# Patient Record
Sex: Male | Born: 1935 | Race: White | Hispanic: No | Smoking: Never smoker
Health system: Southern US, Community
[De-identification: ages and names within clinical notes are randomized; demographics above are authoritative.]

## PROBLEM LIST (undated history)

## (undated) DIAGNOSIS — I4891 Unspecified atrial fibrillation: Secondary | ICD-10-CM

## (undated) DIAGNOSIS — K219 Gastro-esophageal reflux disease without esophagitis: Secondary | ICD-10-CM

## (undated) DIAGNOSIS — F0390 Unspecified dementia without behavioral disturbance: Secondary | ICD-10-CM

## (undated) DIAGNOSIS — R0902 Hypoxemia: Secondary | ICD-10-CM

## (undated) DIAGNOSIS — IMO0001 Reserved for inherently not codable concepts without codable children: Secondary | ICD-10-CM

## (undated) DIAGNOSIS — Z7901 Long term (current) use of anticoagulants: Secondary | ICD-10-CM

## (undated) DIAGNOSIS — I10 Essential (primary) hypertension: Secondary | ICD-10-CM

## (undated) DIAGNOSIS — I509 Heart failure, unspecified: Secondary | ICD-10-CM

## (undated) DIAGNOSIS — I639 Cerebral infarction, unspecified: Secondary | ICD-10-CM

## (undated) DIAGNOSIS — I219 Acute myocardial infarction, unspecified: Secondary | ICD-10-CM

## (undated) DIAGNOSIS — E785 Hyperlipidemia, unspecified: Secondary | ICD-10-CM

## (undated) DIAGNOSIS — I499 Cardiac arrhythmia, unspecified: Secondary | ICD-10-CM

## (undated) DIAGNOSIS — E876 Hypokalemia: Secondary | ICD-10-CM

## (undated) HISTORY — DX: Acute myocardial infarction, unspecified: I21.9

## (undated) HISTORY — DX: Cerebral infarction, unspecified: I63.9

## (undated) HISTORY — DX: Unspecified atrial fibrillation: I48.91

## (undated) HISTORY — DX: Essential (primary) hypertension: I10

## (undated) HISTORY — DX: Gastro-esophageal reflux disease without esophagitis: K21.9

## (undated) HISTORY — DX: Unspecified dementia, unspecified severity, without behavioral disturbance, psychotic disturbance, mood disturbance, and anxiety: F03.90

## (undated) HISTORY — DX: Long term (current) use of anticoagulants: Z79.01

## (undated) HISTORY — DX: Hyperlipidemia, unspecified: E78.5

## (undated) HISTORY — DX: Hypoxemia: R09.02

## (undated) HISTORY — DX: Hypokalemia: E87.6

## (undated) HISTORY — DX: Heart failure, unspecified: I50.9

---

## 2007-05-01 ENCOUNTER — Inpatient Hospital Stay: Payer: Self-pay | Admitting: Internal Medicine

## 2007-05-01 ENCOUNTER — Other Ambulatory Visit: Payer: Self-pay

## 2008-10-02 ENCOUNTER — Ambulatory Visit: Payer: Self-pay | Admitting: Family Medicine

## 2010-02-15 ENCOUNTER — Ambulatory Visit: Payer: Self-pay | Admitting: Ophthalmology

## 2010-02-26 ENCOUNTER — Ambulatory Visit: Payer: Self-pay | Admitting: Ophthalmology

## 2010-05-14 ENCOUNTER — Other Ambulatory Visit: Payer: Self-pay | Admitting: Internal Medicine

## 2010-10-05 ENCOUNTER — Inpatient Hospital Stay: Payer: Self-pay | Admitting: Internal Medicine

## 2011-03-20 ENCOUNTER — Inpatient Hospital Stay: Payer: Self-pay | Admitting: Internal Medicine

## 2011-03-20 DIAGNOSIS — I369 Nonrheumatic tricuspid valve disorder, unspecified: Secondary | ICD-10-CM

## 2011-03-20 DIAGNOSIS — I509 Heart failure, unspecified: Secondary | ICD-10-CM

## 2011-04-30 ENCOUNTER — Other Ambulatory Visit: Payer: Self-pay | Admitting: Internal Medicine

## 2011-10-10 ENCOUNTER — Other Ambulatory Visit: Payer: Self-pay | Admitting: Internal Medicine

## 2011-10-10 LAB — PROTIME-INR: Prothrombin Time: 24.3 secs — ABNORMAL HIGH (ref 11.5–14.7)

## 2011-11-10 ENCOUNTER — Ambulatory Visit: Payer: Self-pay | Admitting: Ophthalmology

## 2011-11-10 LAB — POTASSIUM: Potassium: 4.6 mmol/L (ref 3.5–5.1)

## 2011-11-25 ENCOUNTER — Ambulatory Visit: Payer: Self-pay | Admitting: Ophthalmology

## 2011-12-10 DIAGNOSIS — I4891 Unspecified atrial fibrillation: Secondary | ICD-10-CM

## 2012-01-30 ENCOUNTER — Other Ambulatory Visit: Payer: Self-pay | Admitting: Internal Medicine

## 2012-01-30 LAB — CBC WITH DIFFERENTIAL/PLATELET
Basophil #: 0 10*3/uL (ref 0.0–0.1)
Basophil %: 0.7 %
Eosinophil #: 0.1 10*3/uL (ref 0.0–0.7)
HGB: 14.1 g/dL (ref 13.0–18.0)
Lymphocyte %: 30.1 %
MCH: 32 pg (ref 26.0–34.0)
MCHC: 34.6 g/dL (ref 32.0–36.0)
Monocyte #: 0.5 x10 3/mm (ref 0.2–1.0)
Neutrophil #: 2.8 10*3/uL (ref 1.4–6.5)
Neutrophil %: 58.3 %
RDW: 15 % — ABNORMAL HIGH (ref 11.5–14.5)
WBC: 4.8 10*3/uL (ref 3.8–10.6)

## 2012-01-30 LAB — COMPREHENSIVE METABOLIC PANEL
Alkaline Phosphatase: 68 U/L (ref 50–136)
Anion Gap: 10 (ref 7–16)
BUN: 19 mg/dL — ABNORMAL HIGH (ref 7–18)
Bilirubin,Total: 0.9 mg/dL (ref 0.2–1.0)
Co2: 23 mmol/L (ref 21–32)
Creatinine: 1.03 mg/dL (ref 0.60–1.30)
EGFR (Non-African Amer.): >60
Glucose: 78 mg/dL (ref 65–99)
SGPT (ALT): 24 U/L (ref 12–78)
Total Protein: 6.9 g/dL (ref 6.4–8.2)

## 2012-01-30 LAB — PROTIME-INR: INR: 1.7

## 2012-03-25 DIAGNOSIS — E876 Hypokalemia: Secondary | ICD-10-CM | POA: Insufficient documentation

## 2012-03-25 DIAGNOSIS — Z9229 Personal history of other drug therapy: Secondary | ICD-10-CM | POA: Insufficient documentation

## 2013-04-28 ENCOUNTER — Ambulatory Visit: Payer: Self-pay | Admitting: Internal Medicine

## 2013-04-28 LAB — PROTIME-INR
INR: 2.1
Prothrombin Time: 23.4 secs — ABNORMAL HIGH (ref 11.5–14.7)

## 2013-09-04 ENCOUNTER — Inpatient Hospital Stay: Payer: Self-pay | Admitting: Family Medicine

## 2013-09-04 LAB — CBC
HCT: 38.3 % — ABNORMAL LOW (ref 40.0–52.0)
HGB: 12.6 g/dL — ABNORMAL LOW (ref 13.0–18.0)
MCH: 30.7 pg (ref 26.0–34.0)
MCHC: 32.8 g/dL (ref 32.0–36.0)
MCV: 94 fL (ref 80–100)
PLATELETS: 73 10*3/uL — AB (ref 150–440)
RBC: 4.1 10*6/uL — ABNORMAL LOW (ref 4.40–5.90)
RDW: 15.5 % — ABNORMAL HIGH (ref 11.5–14.5)
WBC: 3.4 10*3/uL — AB (ref 3.8–10.6)

## 2013-09-04 LAB — MAGNESIUM: Magnesium: 2 mg/dL

## 2013-09-04 LAB — BASIC METABOLIC PANEL
ANION GAP: 7 (ref 7–16)
BUN: 25 mg/dL — ABNORMAL HIGH (ref 7–18)
CO2: 27 mmol/L (ref 21–32)
Calcium, Total: 7.8 mg/dL — ABNORMAL LOW (ref 8.5–10.1)
Chloride: 104 mmol/L (ref 98–107)
Creatinine: 1.31 mg/dL — ABNORMAL HIGH (ref 0.60–1.30)
EGFR (African American): 55 — ABNORMAL LOW
EGFR (Non-African Amer.): 47 — ABNORMAL LOW
GLUCOSE: 153 mg/dL — AB (ref 65–99)
OSMOLALITY: 283 (ref 275–301)
Potassium: 3.4 mmol/L — ABNORMAL LOW (ref 3.5–5.1)
Sodium: 138 mmol/L (ref 136–145)

## 2013-09-04 LAB — TROPONIN I
Troponin-I: 0.04 ng/mL
Troponin-I: 0.05 ng/mL

## 2013-09-04 LAB — PROTIME-INR
INR: 1.5
PROTHROMBIN TIME: 17.9 s — AB (ref 11.5–14.7)

## 2013-09-04 LAB — PRO B NATRIURETIC PEPTIDE: B-TYPE NATIURETIC PEPTID: 4807 pg/mL — AB (ref 0–450)

## 2013-09-05 LAB — COMPREHENSIVE METABOLIC PANEL
ALBUMIN: 3.1 g/dL — AB (ref 3.4–5.0)
ALK PHOS: 55 U/L
ALT: 27 U/L (ref 12–78)
ANION GAP: 7 (ref 7–16)
BILIRUBIN TOTAL: 1.1 mg/dL — AB (ref 0.2–1.0)
BUN: 21 mg/dL — AB (ref 7–18)
CHLORIDE: 100 mmol/L (ref 98–107)
CO2: 30 mmol/L (ref 21–32)
Calcium, Total: 7.9 mg/dL — ABNORMAL LOW (ref 8.5–10.1)
Creatinine: 1.11 mg/dL (ref 0.60–1.30)
EGFR (Non-African Amer.): 58 — ABNORMAL LOW
Glucose: 92 mg/dL (ref 65–99)
Osmolality: 276 (ref 275–301)
Potassium: 3.1 mmol/L — ABNORMAL LOW (ref 3.5–5.1)
SGOT(AST): 51 U/L — ABNORMAL HIGH (ref 15–37)
Sodium: 137 mmol/L (ref 136–145)
Total Protein: 6.6 g/dL (ref 6.4–8.2)

## 2013-09-05 LAB — LIPID PANEL
Cholesterol: 103 mg/dL (ref 0–200)
HDL Cholesterol: 30 mg/dL — ABNORMAL LOW (ref 40–60)
Ldl Cholesterol, Calc: 60 mg/dL (ref 0–100)
Triglycerides: 64 mg/dL (ref 0–200)
VLDL CHOLESTEROL, CALC: 13 mg/dL (ref 5–40)

## 2013-09-05 LAB — CBC WITH DIFFERENTIAL/PLATELET
Basophil #: 0 10*3/uL (ref 0.0–0.1)
Basophil %: 0.4 %
EOS ABS: 0 10*3/uL (ref 0.0–0.7)
EOS PCT: 0 %
HCT: 36.6 % — ABNORMAL LOW (ref 40.0–52.0)
HGB: 12.2 g/dL — ABNORMAL LOW (ref 13.0–18.0)
Lymphocyte #: 0.8 10*3/uL — ABNORMAL LOW (ref 1.0–3.6)
Lymphocyte %: 18.8 %
MCH: 30.7 pg (ref 26.0–34.0)
MCHC: 33.4 g/dL (ref 32.0–36.0)
MCV: 92 fL (ref 80–100)
MONO ABS: 0.4 x10 3/mm (ref 0.2–1.0)
MONOS PCT: 9.7 %
Neutrophil #: 3.2 10*3/uL (ref 1.4–6.5)
Neutrophil %: 71.1 %
PLATELETS: 70 10*3/uL — AB (ref 150–440)
RBC: 3.99 10*6/uL — ABNORMAL LOW (ref 4.40–5.90)
RDW: 15 % — ABNORMAL HIGH (ref 11.5–14.5)
WBC: 4.5 10*3/uL (ref 3.8–10.6)

## 2013-09-05 LAB — PROTIME-INR
INR: 1.5
PROTHROMBIN TIME: 18.1 s — AB (ref 11.5–14.7)

## 2013-09-06 LAB — BASIC METABOLIC PANEL
ANION GAP: 5 — AB (ref 7–16)
BUN: 24 mg/dL — ABNORMAL HIGH (ref 7–18)
CREATININE: 1.11 mg/dL (ref 0.60–1.30)
Calcium, Total: 7.8 mg/dL — ABNORMAL LOW (ref 8.5–10.1)
Chloride: 99 mmol/L (ref 98–107)
Co2: 33 mmol/L — ABNORMAL HIGH (ref 21–32)
EGFR (African American): >60
EGFR (Non-African Amer.): 58 — ABNORMAL LOW
Glucose: 84 mg/dL (ref 65–99)
OSMOLALITY: 277 (ref 275–301)
POTASSIUM: 3.2 mmol/L — AB (ref 3.5–5.1)
Sodium: 137 mmol/L (ref 136–145)

## 2013-09-06 LAB — CBC WITH DIFFERENTIAL/PLATELET
Basophil #: 0 10*3/uL (ref 0.0–0.1)
Basophil %: 0.4 %
EOS ABS: 0 10*3/uL (ref 0.0–0.7)
Eosinophil %: 0.2 %
HCT: 37.6 % — ABNORMAL LOW (ref 40.0–52.0)
HGB: 12.3 g/dL — AB (ref 13.0–18.0)
LYMPHS PCT: 24 %
Lymphocyte #: 1 10*3/uL (ref 1.0–3.6)
MCH: 29.9 pg (ref 26.0–34.0)
MCHC: 32.7 g/dL (ref 32.0–36.0)
MCV: 92 fL (ref 80–100)
Monocyte #: 0.4 x10 3/mm (ref 0.2–1.0)
Monocyte %: 10.2 %
Neutrophil #: 2.7 10*3/uL (ref 1.4–6.5)
Neutrophil %: 65.2 %
PLATELETS: 83 10*3/uL — AB (ref 150–440)
RBC: 4.11 10*6/uL — ABNORMAL LOW (ref 4.40–5.90)
RDW: 15.1 % — AB (ref 11.5–14.5)
WBC: 4.1 10*3/uL (ref 3.8–10.6)

## 2013-09-06 LAB — PROTIME-INR
INR: 1.6
Prothrombin Time: 19 secs — ABNORMAL HIGH (ref 11.5–14.7)

## 2013-09-06 LAB — MAGNESIUM: Magnesium: 2.1 mg/dL

## 2013-09-07 LAB — BASIC METABOLIC PANEL
Anion Gap: 5 — ABNORMAL LOW (ref 7–16)
BUN: 32 mg/dL — ABNORMAL HIGH (ref 7–18)
CALCIUM: 8.1 mg/dL — AB (ref 8.5–10.1)
Chloride: 100 mmol/L (ref 98–107)
Co2: 32 mmol/L (ref 21–32)
Creatinine: 1.19 mg/dL (ref 0.60–1.30)
EGFR (African American): >60
EGFR (Non-African Amer.): 53 — ABNORMAL LOW
GLUCOSE: 79 mg/dL (ref 65–99)
Osmolality: 280 (ref 275–301)
POTASSIUM: 3.8 mmol/L (ref 3.5–5.1)
Sodium: 137 mmol/L (ref 136–145)

## 2013-09-07 LAB — PROTIME-INR
INR: 1.9
Prothrombin Time: 21.2 secs — ABNORMAL HIGH (ref 11.5–14.7)

## 2013-09-09 LAB — BASIC METABOLIC PANEL
Anion Gap: 6 — ABNORMAL LOW (ref 7–16)
BUN: 34 mg/dL — ABNORMAL HIGH (ref 7–18)
CO2: 32 mmol/L (ref 21–32)
Calcium, Total: 8.5 mg/dL (ref 8.5–10.1)
Chloride: 99 mmol/L (ref 98–107)
Creatinine: 1.3 mg/dL (ref 0.60–1.30)
EGFR (Non-African Amer.): 48 — ABNORMAL LOW
GFR CALC AF AMER: 55 — AB
Glucose: 88 mg/dL (ref 65–99)
Osmolality: 281 (ref 275–301)
POTASSIUM: 4.1 mmol/L (ref 3.5–5.1)
SODIUM: 137 mmol/L (ref 136–145)

## 2013-09-09 LAB — PROTIME-INR
INR: 2.4
Prothrombin Time: 25.2 secs — ABNORMAL HIGH (ref 11.5–14.7)

## 2013-09-14 ENCOUNTER — Emergency Department: Payer: Self-pay

## 2013-09-14 LAB — BASIC METABOLIC PANEL
Anion Gap: 4 — ABNORMAL LOW (ref 7–16)
BUN: 27 mg/dL — ABNORMAL HIGH (ref 7–18)
Calcium, Total: 8.6 mg/dL (ref 8.5–10.1)
Chloride: 103 mmol/L (ref 98–107)
Co2: 33 mmol/L — ABNORMAL HIGH (ref 21–32)
Creatinine: 1.43 mg/dL — ABNORMAL HIGH (ref 0.60–1.30)
EGFR (African American): 49 — ABNORMAL LOW
GFR CALC NON AF AMER: 43 — AB
Glucose: 123 mg/dL — ABNORMAL HIGH (ref 65–99)
OSMOLALITY: 286 (ref 275–301)
POTASSIUM: 3.9 mmol/L (ref 3.5–5.1)
Sodium: 140 mmol/L (ref 136–145)

## 2013-09-14 LAB — PROTIME-INR
INR: 2.5
Prothrombin Time: 26.3 secs — ABNORMAL HIGH (ref 11.5–14.7)

## 2013-09-14 LAB — CBC
HCT: 41.2 % (ref 40.0–52.0)
HGB: 13.5 g/dL (ref 13.0–18.0)
MCH: 29.9 pg (ref 26.0–34.0)
MCHC: 32.8 g/dL (ref 32.0–36.0)
MCV: 91 fL (ref 80–100)
Platelet: 238 10*3/uL (ref 150–440)
RBC: 4.52 10*6/uL (ref 4.40–5.90)
RDW: 15.1 % — ABNORMAL HIGH (ref 11.5–14.5)
WBC: 7.5 10*3/uL (ref 3.8–10.6)

## 2013-11-02 DIAGNOSIS — R609 Edema, unspecified: Secondary | ICD-10-CM | POA: Insufficient documentation

## 2013-11-04 ENCOUNTER — Inpatient Hospital Stay: Payer: Self-pay | Admitting: Internal Medicine

## 2013-11-04 LAB — PROTIME-INR
INR: 3.9
Prothrombin Time: 37 secs — ABNORMAL HIGH (ref 11.5–14.7)

## 2013-11-04 LAB — COMPREHENSIVE METABOLIC PANEL
ALBUMIN: 3.3 g/dL — AB (ref 3.4–5.0)
Alkaline Phosphatase: 65 U/L
Anion Gap: 6 — ABNORMAL LOW (ref 7–16)
BUN: 18 mg/dL (ref 7–18)
Bilirubin,Total: 1.7 mg/dL — ABNORMAL HIGH (ref 0.2–1.0)
CREATININE: 1.34 mg/dL — AB (ref 0.60–1.30)
Calcium, Total: 8.2 mg/dL — ABNORMAL LOW (ref 8.5–10.1)
Chloride: 103 mmol/L (ref 98–107)
Co2: 27 mmol/L (ref 21–32)
EGFR (Non-African Amer.): 46 — ABNORMAL LOW
GFR CALC AF AMER: 53 — AB
Glucose: 87 mg/dL (ref 65–99)
Osmolality: 273 (ref 275–301)
POTASSIUM: 3.5 mmol/L (ref 3.5–5.1)
SGOT(AST): 40 U/L — ABNORMAL HIGH (ref 15–37)
SGPT (ALT): 21 U/L (ref 12–78)
SODIUM: 136 mmol/L (ref 136–145)
Total Protein: 7.2 g/dL (ref 6.4–8.2)

## 2013-11-04 LAB — APTT: ACTIVATED PTT: 63.1 s — AB (ref 23.6–35.9)

## 2013-11-04 LAB — CBC
HCT: 33.8 % — ABNORMAL LOW (ref 40.0–52.0)
HGB: 11.4 g/dL — ABNORMAL LOW (ref 13.0–18.0)
MCH: 31.4 pg (ref 26.0–34.0)
MCHC: 33.6 g/dL (ref 32.0–36.0)
MCV: 93 fL (ref 80–100)
PLATELETS: 106 10*3/uL — AB (ref 150–440)
RBC: 3.62 10*6/uL — AB (ref 4.40–5.90)
RDW: 17.3 % — ABNORMAL HIGH (ref 11.5–14.5)
WBC: 7 10*3/uL (ref 3.8–10.6)

## 2013-11-04 LAB — PRO B NATRIURETIC PEPTIDE: B-Type Natriuretic Peptide: 6618 pg/mL — ABNORMAL HIGH (ref 0–450)

## 2013-11-04 LAB — TROPONIN I
Troponin-I: 0.09 ng/mL — ABNORMAL HIGH
Troponin-I: 0.07 ng/mL — ABNORMAL HIGH
Troponin-I: 0.06 ng/mL — ABNORMAL HIGH

## 2013-11-04 LAB — CK TOTAL AND CKMB (NOT AT ARMC)
CK, TOTAL: 61 U/L
CK-MB: 1.4 ng/mL (ref 0.5–3.6)

## 2013-11-05 LAB — PROTIME-INR
INR: 4.5 — AB
PROTHROMBIN TIME: 41.5 s — AB (ref 11.5–14.7)

## 2013-11-06 LAB — BASIC METABOLIC PANEL
Anion Gap: 6 — ABNORMAL LOW (ref 7–16)
BUN: 20 mg/dL — ABNORMAL HIGH (ref 7–18)
CALCIUM: 8.2 mg/dL — AB (ref 8.5–10.1)
CHLORIDE: 100 mmol/L (ref 98–107)
CO2: 29 mmol/L (ref 21–32)
CREATININE: 1.23 mg/dL (ref 0.60–1.30)
EGFR (African American): 59 — ABNORMAL LOW
GFR CALC NON AF AMER: 51 — AB
GLUCOSE: 86 mg/dL (ref 65–99)
OSMOLALITY: 272 (ref 275–301)
Potassium: 3.2 mmol/L — ABNORMAL LOW (ref 3.5–5.1)
Sodium: 135 mmol/L — ABNORMAL LOW (ref 136–145)

## 2013-11-06 LAB — CBC WITH DIFFERENTIAL/PLATELET
BASOS ABS: 0 10*3/uL (ref 0.0–0.1)
BASOS PCT: 0.9 %
EOS PCT: 1.9 %
Eosinophil #: 0.1 10*3/uL (ref 0.0–0.7)
HCT: 32.5 % — ABNORMAL LOW (ref 40.0–52.0)
HGB: 10.8 g/dL — ABNORMAL LOW (ref 13.0–18.0)
LYMPHS ABS: 1.2 10*3/uL (ref 1.0–3.6)
Lymphocyte %: 21.6 %
MCH: 31 pg (ref 26.0–34.0)
MCHC: 33.3 g/dL (ref 32.0–36.0)
MCV: 93 fL (ref 80–100)
Monocyte #: 0.7 x10 3/mm (ref 0.2–1.0)
Monocyte %: 12.4 %
NEUTROS ABS: 3.4 10*3/uL (ref 1.4–6.5)
NEUTROS PCT: 63.2 %
PLATELETS: 97 10*3/uL — AB (ref 150–440)
RBC: 3.49 10*6/uL — AB (ref 4.40–5.90)
RDW: 17.9 % — AB (ref 11.5–14.5)
WBC: 5.4 10*3/uL (ref 3.8–10.6)

## 2013-11-06 LAB — PROTIME-INR
INR: 4.6
Prothrombin Time: 41.7 secs — ABNORMAL HIGH (ref 11.5–14.7)

## 2013-11-07 LAB — BASIC METABOLIC PANEL
ANION GAP: 6 — AB (ref 7–16)
BUN: 24 mg/dL — AB (ref 7–18)
Calcium, Total: 8.1 mg/dL — ABNORMAL LOW (ref 8.5–10.1)
Chloride: 102 mmol/L (ref 98–107)
Co2: 28 mmol/L (ref 21–32)
Creatinine: 1.35 mg/dL — ABNORMAL HIGH (ref 0.60–1.30)
EGFR (African American): 53 — ABNORMAL LOW
EGFR (Non-African Amer.): 46 — ABNORMAL LOW
Glucose: 81 mg/dL (ref 65–99)
OSMOLALITY: 275 (ref 275–301)
Potassium: 3.4 mmol/L — ABNORMAL LOW (ref 3.5–5.1)
Sodium: 136 mmol/L (ref 136–145)

## 2013-11-07 LAB — PROTIME-INR
INR: 4
Prothrombin Time: 37.9 secs — ABNORMAL HIGH (ref 11.5–14.7)

## 2013-11-14 DIAGNOSIS — Z66 Do not resuscitate: Secondary | ICD-10-CM | POA: Insufficient documentation

## 2014-03-29 DIAGNOSIS — F0390 Unspecified dementia without behavioral disturbance: Secondary | ICD-10-CM | POA: Insufficient documentation

## 2014-03-29 DIAGNOSIS — R0902 Hypoxemia: Secondary | ICD-10-CM | POA: Insufficient documentation

## 2014-05-08 ENCOUNTER — Other Ambulatory Visit: Payer: Self-pay | Admitting: *Deleted

## 2014-05-08 ENCOUNTER — Ambulatory Visit (INDEPENDENT_AMBULATORY_CARE_PROVIDER_SITE_OTHER): Payer: Medicare Other | Admitting: Podiatry

## 2014-05-08 ENCOUNTER — Encounter: Payer: Self-pay | Admitting: Podiatry

## 2014-05-08 ENCOUNTER — Ambulatory Visit: Payer: Self-pay | Admitting: Podiatry

## 2014-05-08 VITALS — BP 105/55 | HR 64 | Resp 16 | Ht 67.0 in | Wt 203.0 lb

## 2014-05-08 DIAGNOSIS — I4891 Unspecified atrial fibrillation: Secondary | ICD-10-CM | POA: Insufficient documentation

## 2014-05-08 DIAGNOSIS — I219 Acute myocardial infarction, unspecified: Secondary | ICD-10-CM | POA: Insufficient documentation

## 2014-05-08 DIAGNOSIS — S060X9A Concussion with loss of consciousness of unspecified duration, initial encounter: Secondary | ICD-10-CM | POA: Insufficient documentation

## 2014-05-08 DIAGNOSIS — I639 Cerebral infarction, unspecified: Secondary | ICD-10-CM | POA: Insufficient documentation

## 2014-05-08 DIAGNOSIS — E7800 Pure hypercholesterolemia, unspecified: Secondary | ICD-10-CM | POA: Insufficient documentation

## 2014-05-08 DIAGNOSIS — I509 Heart failure, unspecified: Secondary | ICD-10-CM | POA: Insufficient documentation

## 2014-05-08 DIAGNOSIS — M79603 Pain in arm, unspecified: Secondary | ICD-10-CM

## 2014-05-08 DIAGNOSIS — G8929 Other chronic pain: Secondary | ICD-10-CM | POA: Insufficient documentation

## 2014-05-08 DIAGNOSIS — K219 Gastro-esophageal reflux disease without esophagitis: Secondary | ICD-10-CM | POA: Insufficient documentation

## 2014-05-08 DIAGNOSIS — B351 Tinea unguium: Secondary | ICD-10-CM | POA: Diagnosis not present

## 2014-05-08 DIAGNOSIS — M549 Dorsalgia, unspecified: Secondary | ICD-10-CM

## 2014-05-08 DIAGNOSIS — I10 Essential (primary) hypertension: Secondary | ICD-10-CM | POA: Insufficient documentation

## 2014-05-08 NOTE — Progress Notes (Signed)
   Subjective:    Patient ID: Terry Huerta, male    DOB: January 15, 1920, 80 y.o.   MRN: [id number]  HPI Comments: Needs to have his toenail trim      Review of Systems  Cardiovascular: Positive for leg swelling.  Endocrine: Positive for cold intolerance.  Hematological: Bruises/bleeds easily.  Psychiatric/Behavioral: Positive for confusion.  All other systems reviewed and are negative.      Objective:   Physical Exam: I have reviewed his past medical history medications allergies surgery social history of systems. Pulses are strongly palpable bilateral nails are thick yellow dystrophic onychomycotic. Orthopedic evaluation of his alternatives to surgical for range of motion or crepitus. Degenerative flexors are intact bilateral muscle strength is 5 over 5 for flexible fixation of her fevers office musculatures intact.        Assessment & Plan:  Assessment: Painful nails 1 through 5 bilateral secondary to onychomycosis.  Plan: Debridement of nails 1 through 5 bilateral chromosome secondary to pain.

## 2014-05-22 ENCOUNTER — Ambulatory Visit: Payer: Self-pay | Admitting: Podiatry

## 2014-08-07 ENCOUNTER — Other Ambulatory Visit: Payer: PRIVATE HEALTH INSURANCE

## 2014-08-26 ENCOUNTER — Emergency Department
Admit: 2014-08-26 | Disposition: A | Discharge status: Home / Self Care | Payer: Self-pay | Admitting: Emergency Medicine

## 2014-09-07 ENCOUNTER — Emergency Department
Admit: 2014-09-07 | Disposition: A | Discharge status: Home / Self Care | Payer: Self-pay | Admitting: Emergency Medicine

## 2014-09-16 NOTE — Discharge Summary (Signed)
PATIENT NAME:  Terry Huerta, Terry Huerta MR#:  [id number] DATE OF BIRTH:  May 13, 1920  DATE OF ADMISSION:  09/04/2013 DATE OF DISCHARGE:  09/09/2013   REASON FOR ADMISSION: Shortness of breath.   DISCHARGE DIAGNOSES:  1. General weakness, debility. 2. Acute on chronic systolic and diastolic congestive heart failure.  3. Moderate to severe pulmonary hypertension.  4. Acute bronchitis.  5. Dehydration.  6. Hypokalemia.  7. Atrial fibrillation.  8. Chronic anticoagulation.  9. Hypertension.  10. Hyperlipidemia.  11. Chronic thrombocytopenia.  12. Anemia of chronic disease, stable.  13. Chronic kidney disease.   DISPOSITION: Skilled nursing facility.   MEDICATIONS AT DISCHARGE:  1. Simvastatin 40 mg once a day.  2. Coreg 6.25 mg twice daily.  3. Warfarin 6 mg at 5:00 p.m.  4. Enalapril 2.5 mg daily. 5. Multivitamins once daily. 6. PreserVision twice daily.  7. Furosemide 40 mg twice daily. 8. Nitroglycerin 0.4 mg sublingual as needed for chest pain.  9. Digoxin 0.125 mg 1 daily.  10. Albuterol/ipratropium nebulizers as needed for shortness of breath. 11. Protonix 40 mg once a day.  Recommended to stop taking aspirin as the patient is already fully anticoagulated with warfarin.   FOLLOWUP: Dr. Dorothyann Peng in 1 to 2 weeks, Dr. Rolm Gala in 2 to 4 weeks.   CONDITION AT DISCHARGE: Stable.   DIET: Low salt.   HOSPITAL COURSE: This is a very nice 81 year old gentleman who has history of congestive heart failure, which has been overall diastolic in the past. He presents to the Emergency Department complaining of cough, shortness of breath and swelling of the lower extremities for within 3 days. The patient had also orthopnea and nocturnal dyspnea, but no fevers or chills. He has a history of previous echocardiogram showing ejection fraction of 50% and known history of atrial fibrillation, for which he is on anticoagulation. The patient was admitted for the treatment of congestive heart  failure.   1. Acute on chronic systolic and diastolic heart failure. The patient had an echocardiogram that showed severe dilation of the right ventricle, severe decrease in function of the right ventricle, with moderate pulmonary hypertension. His ejection fraction on the left ventricle was also reduced down to 40% to 45%.   The patient has been going on through a lot of stress due to the loss of his wife, and he was functioning well up until this admission. He has been weak and not ambulating very well, and the family is concerned about this, for which the patient was discharged into a skilled nursing facility. He has been started on a beta blocker, Lasix, ACE inhibitor and statin in the past. We are starting digoxin due to biventricular heart failure, so digoxin needs to be monitored for toxicity. Consider the use of phosphodiesterase-5 inhibitors,  as sildenafil, since they increase the production of nitrous oxide and natural vasodilators. The other medication that could be used in this case is calcium channel blocker. At this moment, we are not starting any of those medications as he has improved, and his blood pressure and heart rate might not tolerate it. The patient is on ACE inhibitor and not on aspirin due to use of Coumadin.  2. As far as hypokalemia, is was replaced.  3. Atrial fibrillation remained controlled during this hospitalization.  4. His blood pressure is stable at discharge.  5. He has thrombocytopenia, which was a combination of chronic and consumptive. Ultrasound of the spleen was done, and it was normal. No signs of hypersplenism. 6.  The patient has hyperlipidemia, and he was starting on a statin.  7. As far as any other medical problems, they were stable during this hospitalization. The patient is discharged in good condition, requiring 2 liters of oxygen. He is probably going to need oxygen from now on.   TIME SPENT: I spent about 45 minutes discharging this patient today.    ____________________________ Felipa Furnace, MD rsg:lb D: 09/09/2013 13:06:18 ET T: 09/09/2013 13:19:22 ET JOB#: [id number]  cc: Felipa Furnace, MD, <Dictator> Dwayne D. Juliann Pares, MD Letitia Caul, MD Regan Rakers Juanda Chance MD ELECTRONICALLY SIGNED 09/11/2013 5:24

## 2014-09-16 NOTE — Discharge Summary (Signed)
Dates of Admission and Diagnosis:  Date of Admission 04-Nov-2013   Date of Discharge 07-Nov-2013   Admitting Diagnosis CHF   Final Diagnosis ac systolic CHF Pneumonia Ac on ch respi failure Htn Ac worsening of Ch Renal failure Ch afib- high INR level.    Chief Complaint/History of Present Illness a 81 year old male with past medical history of CHF, ejection fraction right around 45% to 50%, atrial fibrillation, hypertension, hyperlipidemia, cerebrovascular accident, who was in hospital almost 2 months ago and after that, he was discharged to a rehab center. From there, he came home 2 weeks ago. He was prescribed oxygen, but it took too long to arrange for oxygen at home. Finally, he got oxygen set up at home yesterday, supposed to take 2 liters oxygen all the time. The patient and his friend, also who is present in the room who is caretaker, as per them, for the last 2 to 3 days he has progressive worsening of his shortness of breath. He followed up with his primary care doctor a week ago, and they increased the dose of Lasix. They also increased dose of enalapril. The patient had seen Dr. Clayborn Bigness 2 weeks ago and he started him on digoxin, and he has been taking all the medications as prescribed regularly, except night before last night, he forgot to take all the medications at nighttime. For the last 2 to 3 days, he also noticed his legs are getting more and more swollen and they are somewhat red, finally decided to come to the Emergency Room and when he came, he was found having elevated BNP level with acute CHF exacerbation and so given to hospitalist team for admission after giving him IV Lasix.   Allergies:  No Known Allergies:   Hepatic:  12-Jun-15 09:13   Bilirubin, Total  1.7  Alkaline Phosphatase 65 (45-117 NOTE: New Reference Range 04/15/13)  SGPT (ALT) 21  SGOT (AST)  40  Total Protein, Serum 7.2  Albumin, Serum  3.3  Routine Chem:  12-Jun-15 09:13   Glucose, Serum 87   BUN 18  Creatinine (comp)  1.34  Sodium, Serum 136  Potassium, Serum 3.5  Chloride, Serum 103  CO2, Serum 27  Calcium (Total), Serum  8.2  Anion Gap  6  Osmolality (calc) 273  eGFR (African American)  53  eGFR (Non-African American)  46 (eGFR values <37m/min/1.73 m2 may be an indication of chronic kidney disease (CKD). Calculated eGFR is useful in patients with stable renal function. The eGFR calculation will not be reliable in acutely ill patients when serum creatinine is changing rapidly. It is not useful in  patients on dialysis. The eGFR calculation may not be applicable to patients at the low and high extremes of body sizes, pregnant women, and vegetarians.)  Result Comment TROPONIN - RESULTS VERIFIED BY REPEAT TESTING.  - Notified [place]@ 1012 on  - 11/04/2013. [facility]  - READ-BACK PROCESS PERFORMED.  Result(s) reported on 04 Nov 2013 at 10:17AM.  B-Type Natriuretic Peptide ([facility]  [phone](Result(s) reported on 04 Nov 2013 at 10:12AM.)  15-Jun-15 04:14   Glucose, Serum 81  BUN  24  Creatinine (comp)  1.35  Sodium, Serum 136  Potassium, Serum  3.4  Chloride, Serum 102  CO2, Serum 28  Calcium (Total), Serum  8.1  Anion Gap  6  Osmolality (calc) 275  eGFR (African American)  53  eGFR (Non-African American)  46 (eGFR values <63mmin/1.73 m2 may be an indication of chronic kidney disease (CKD). Calculated  eGFR is useful in patients with stable renal function. The eGFR calculation will not be reliable in acutely ill patients when serum creatinine is changing rapidly. It is not useful in  patients on dialysis. The eGFR calculation may not be applicable to patients at the low and high extremes of body sizes, pregnant women, and vegetarians.)  Result Comment PT/INR - RESULTS VERIFIED BY REPEAT TESTING.  - NOTIFIED OF CRITICAL VALUE  - NOTIFIED KASEY VAUGHANS-WARD AT [id number]  - ON 11/07/13.Marland KitchenMarland KitchenGilt Edge  - READ-BACK PROCESS PERFORMED.  Result(s) reported on 07 Nov 2013 at 06:06AM.   Cardiac:  12-Jun-15 09:13   Troponin I  0.09 (0.00-0.05 0.05 ng/mL or less: NEGATIVE  Repeat testing in 3-6 hrs  if clinically indicated. >0.05 ng/mL: POTENTIAL  MYOCARDIAL INJURY. Repeat  testing in 3-6 hrs if  clinically indicated. NOTE: An increase or decrease  of 30% or more on serial  testing suggests a  clinically important change)  CK, Total 61 (39-308 NOTE: NEW REFERENCE RANGE  06/27/2013)  CPK-MB, Serum 1.4 (Result(s) reported on 04 Nov 2013 at 10:12AM.)    14:26   Troponin I  0.07 (0.00-0.05 0.05 ng/mL or less: NEGATIVE  Repeat testing in 3-6 hrs  if clinically indicated. >0.05 ng/mL: POTENTIAL  MYOCARDIAL INJURY. Repeat  testing in 3-6 hrs if  clinically indicated. NOTE: An increase or decrease  of 30% or more on serial  testing suggests a  clinically important change)    16:57   Troponin I  0.06 (0.00-0.05 0.05 ng/mL or less: NEGATIVE  Repeat testing in 3-6 hrs  if clinically indicated. >0.05 ng/mL: POTENTIAL  MYOCARDIAL INJURY. Repeat  testing in 3-6 hrs if  clinically indicated. NOTE: An increase or decrease  of 30% or more on serial  testing suggests a  clinically important change)  Routine Coag:  12-Jun-15 09:13   Prothrombin  37.0  INR 3.9 (INR reference interval applies to patients on anticoagulant therapy. A single INR therapeutic range for coumarins is not optimal for all indications; however, the suggested range for most indications is 2.0 - 3.0. Exceptions to the INR Reference Range may include: Prosthetic heart valves, acute myocardial infarction, prevention of myocardial infarction, and combinations of aspirin and anticoagulant. The need for a higher or lower target INR must be assessed individually. Reference: The Pharmacology and Management of the Vitamin K  antagonists: the seventh ACCP Conference on Antithrombotic and Thrombolytic Therapy. FIEPP.2951 Sept:126 (3suppl): N9146842. A HCT value >55% may artifactually increase the  PT.  In one study,  the increase was an average of 25%. Reference:  "Effect on Routine and Special Coagulation Testing Values of Citrate Anticoagulant Adjustment in Patients with High HCT Values." American Journal of Clinical Pathology 2006;126:400-405.)  Activated PTT (APTT)  63.1 (A HCT value >55% may artifactually increase the APTT. In one study, the increase was an average of 19%. Reference: "Effect on Routine and Special Coagulation Testing Values of Citrate Anticoagulant Adjustment in Patients with High HCT Values." American Journal of Clinical Pathology 2006;126:400-405.)  13-Jun-15 04:58   INR  4.5 (INR reference interval applies to patients on anticoagulant therapy. A single INR therapeutic range for coumarins is not optimal for all indications; however, the suggested range for most indications is 2.0 - 3.0. Exceptions to the INR Reference Range may include: Prosthetic heart valves, acute myocardial infarction, prevention of myocardial infarction, and combinations of aspirin and anticoagulant. The need for a higher or lower target INR must be assessed individually. Reference: The Pharmacology and Management of the  Vitamin K  antagonists: the seventh ACCP Conference on Antithrombotic and Thrombolytic Therapy. NOIBB.0488 Sept:126 (3suppl): N9146842. A HCT value >55% may artifactually increase the PT.  In one study,  the increase was an average of 25%. Reference:  "Effect on Routine and Special Coagulation Testing Values of Citrate Anticoagulant Adjustment in Patients with High HCT Values." American Journal of Clinical Pathology 2006;126:400-405.)  14-Jun-15 06:28   INR  4.6 (INR reference interval applies to patients on anticoagulant therapy. A single INR therapeutic range for coumarins is not optimal for all indications; however, the suggested range for most indications is 2.0 - 3.0. Exceptions to the INR Reference Range may include: Prosthetic heart valves, acute myocardial  infarction, prevention of myocardial infarction, and combinations of aspirin and anticoagulant. The need for a higher or lower target INR must be assessed individually. Reference: The Pharmacology and Management of the Vitamin K  antagonists: the seventh ACCP Conference on Antithrombotic and Thrombolytic Therapy. QBVQX.4503 Sept:126 (3suppl): N9146842. A HCT value >55% may artifactually increase the PT.  In one study,  the increase was an average of 25%. Reference:  "Effect on Routine and Special Coagulation Testing Values of Citrate Anticoagulant Adjustment in Patients with High HCT Values." American Journal of Clinical Pathology 2006;126:400-405.)  15-Jun-15 04:14   Prothrombin  37.9  INR 4.0 (INR reference interval applies to patients on anticoagulant therapy. A single INR therapeutic range for coumarins is not optimal for all indications; however, the suggested range for most indications is 2.0 - 3.0. Exceptions to the INR Reference Range may include: Prosthetic heart valves, acute myocardial infarction, prevention of myocardial infarction, and combinations of aspirin and anticoagulant. The need for a higher or lower target INR must be assessed individually. Reference: The Pharmacology and Management of the Vitamin K  antagonists: the seventh ACCP Conference on Antithrombotic and Thrombolytic Therapy. UUEKC.0034 Sept:126 (3suppl): N9146842. A HCT value >55% may artifactually increase the PT.  In one study,  the increase was an average of 25%. Reference:  "Effect on Routine and Special Coagulation Testing Values of Citrate Anticoagulant Adjustment in Patients with High HCT Values." American Journal of Clinical Pathology 2006;126:400-405.)  Routine Hem:  12-Jun-15 09:13   WBC (CBC) 7.0  RBC (CBC)  3.62  Hemoglobin (CBC)  11.4  Hematocrit (CBC)  33.8  Platelet Count (CBC)  106 (Result(s) reported on 04 Nov 2013 at 09:30AM.)  MCV 93  MCH 31.4  MCHC 33.6  RDW  17.3    PERTINENT RADIOLOGY STUDIES: XRay:    12-Jun-15 09:15, Chest Portable Single View  Chest Portable Single View   REASON FOR EXAM:    Chest Pain  COMMENTS:       PROCEDURE: DXR - DXR PORTABLE CHEST SINGLE VIEW  - Nov 04 2013  9:15AM     CLINICAL DATA:  Shortness of breath.  Prior history of CHF.    EXAM:  PORTABLE CHEST - 1 VIEW    COMPARISON:  Two-view chest x-ray 09/14/2013, 03/21/2011. Portable  chest x-ray 09/06/2013, 09/04/2013.    FINDINGS:  Cardiac silhouette markedly enlarged but stable. Mild diffuse  interstitial pulmonary edema. Bilateral pleural effusions, left  greater than right. Dense consolidation in the left lower lobe  silhouetting the left hemidiaphragm. Mild passive atelectasis in the  right base.     IMPRESSION:  1. Mild CHF and/or fluid overload, with stable marked cardiomegaly  and mild diffuse interstitial pulmonary edema.  2. Bilateral pleural effusions, left greater than right.  3. Dense left lower lobe atelectasis and/or pneumonia.  Mild passive  atelectasis at the right base.      Electronically Signed    By: Evangeline Dakin M.D.    On: 11/04/2013 09:18         Verified VC:BSWHQP E. LAWRENCE, M.D.,    14-Jun-15 08:26, Chest PA and Lateral  Chest PA and Lateral   REASON FOR EXAM:    comapre pneumonia  COMMENTS:       PROCEDURE: DXR - DXR CHEST PA (OR AP) AND LATERAL  - Nov 06 2013  8:26AM     CLINICAL DATA:  Pneumonia .    EXAM:  CHEST  2 VIEW    COMPARISON:  11/04/2013.    FINDINGS:  Mediastinum and hilar structures normal. Cardiomegaly with bilateral  interstitial prominence and bilateral effusions noted. These  findings are consistent with congestive heart failure. Basilar  atelectasis and/or infiltrates/edema noted. No pneumothorax. No  acute osseus abnormality .     IMPRESSION:  1. Congestive heart failure or pulmonary interstitial edema small  pleural effusions. Findings are progressive from prior exam.    2.  Basilar  atelectasis and/or infiltrates.      Electronically Signed    By: Marcello Moores  Register    On: 11/06/2013 09:06     Verified By: Osa Craver, M.D., MD  LabUnknown:    12-Jun-15 09:15, Chest Portable Single View  PACS Image     14-Jun-15 08:26, Chest PA and Lateral  PACS Image    Pertinent Past History:  Pertinent Past History 1.  CHF. Ejection fraction as per echocardiogram in April 2015 is 40% to 45%, severely  enlarged right ventricle and severely reduced right ventricular systolic function, severely dilated right atrium, moderately elevated pulmonary artery systolic pressure.  2.  Atrial fibrillation.  3.  Hypertension.  4.  Hyperlipidemia.  5.  Cerebrovascular accident, no leftover weakness.   Hospital Course:  Hospital Course 1.  Acute systolic congestive heart failure:  IV Lasix and monitor his input and output. appreciated cardiology consult. better today. swich to oral steroids. 2.  Atrial fibrillation: Currently, rate is under control. continue digoxin and carvedilol for rate control. He is on Coumadin. INR is more than 3, so we held his Coumadin at this time. monitor INR. can restart after 3 days from discharge and nurse to folow INR at home.   had some nose bleed- minor- stopped - humidified oxygen- no further need.  3.  Worsening renal function: Baseline creatinine is normal.  little worse. Maybe this is effect of his congestive heart failure or increased Lasix dose by PMD as outpatient. continue monitoring. 4.  Pneumonia: Finding of infiltrate on chest x-ray and has complaint of shortness of breath for the last 2 days. Rocephin and azithromycin.  5.  Hypertension:  carvedilol.  6.  Hyperlipidemia: atorvastatin.  Pt and his son signed up for hospice services at home after discharge.   Condition on Discharge Stable   Code Status:  Code Status No Code/Do Not Resuscitate   DISCHARGE INSTRUCTIONS HOME MEDS:  Medication Reconciliation: Patient's Home Medications at  Discharge:     Medication Instructions  enalapril 2.5 mg oral tablet  1 tab(s) orally once a day (in the morning) for hypertension (0900)   multivitamin  1 tab(s) orally once a day for supplement (0900)   digoxin 125 mcg (0.125 mg) oral tablet  1 tab(s) orally once a day for a-fib (0900)   furosemide 40 mg oral tablet  1 tab(s) orally 2 times a day for hypertension (  0900, 1800)   atorvastatin 20 mg oral tablet  1 tab(s) orally once a day (at bedtime) for hyperlipidemia (2100)   nitroglycerin 0.4 mg sublingual tablet  1 tab(s) sublingual every 5 minutes, As Needed - for Chest Pain   carvedilol 3.125 mg oral tablet  1 tab(s) orally 2 times a day   coumadin 3 mg oral tablet  1 tab(s) orally once a day- start from 11/11/13   ceftin 500 mg oral tablet  1 tab(s) orally 2 times a day x 3 days   azithromycin 500 mg oral tablet  1 tab(s) orally every 24 hours x 2 days   potassium chloride 20 meq oral tablet, extended release  1 tab(s) orally once a day    PRESCRIPTIONS: PRINTED AND PLACED ON CHART   Physician's Instructions:  Home Health? Yes   [facility] Instructions to check INR in next 4 days and communicate result with PMD to adjust dose of coumadin.   Diet Low Sodium   Diet Consistency Regular Consistency   Activity Limitations As tolerated   Return to Work Not Applicable   Time frame for Follow Up Appointment 1-2 weeks  PMD   Other Comments follow in CHF clinic.     Hortencia Pilar M(Family Physician): [facility], [address] [place], [place] [address], [place] [phone]  Electronic Signatures: Vaughan Basta (MD)  (Signed 18-Jun-15 09:04)  Authored: ADMISSION DATE AND DIAGNOSIS, CHIEF COMPLAINT/HPI, Allergies, PERTINENT LABS, PERTINENT RADIOLOGY STUDIES, PERTINENT PAST HISTORY, HOSPITAL COURSE, DISCHARGE INSTRUCTIONS HOME MEDS, PATIENT INSTRUCTIONS, Follow Up Physician   Last Updated: 18-Jun-15 09:04 by  Vaughan Basta (MD)

## 2014-09-16 NOTE — H&P (Signed)
PATIENT NAME:  Terry Huerta, Terry Huerta MR#:  [id number] DATE OF BIRTH:  May 26, 1920  DATE OF ADMISSION:  09/04/2013  ADDENDUM:  ALLERGIES: NO.   HOME MEDICATIONS:  Warfarin 6 mg p.o. daily at 5 p.m., Zocor 40 mg p.o. at bedtime, PreserVision 1 tablet p.o. b.i.d., multivitamin 1 tablet once a day, Lasix 40 mg p.o. in the morning, Enalapril 2.5 mg p.o. daily, Coreg 6.25 mg p.o. b.i.d., aspirin 81 mg p.o. daily.    ____________________________ Shaune Pollack, MD qc:dd D: 09/04/2013 19:01:59 ET T: 09/04/2013 19:50:25 ET JOB#: [id number]  cc: Shaune Pollack, MD, <Dictator> Shaune Pollack MD ELECTRONICALLY SIGNED 09/05/2013 10:53

## 2014-09-16 NOTE — H&P (Signed)
PATIENT NAME:  Terry Huerta, Terry Huerta MR#:  [id number] DATE OF BIRTH:  09-17-1919  DATE OF ADMISSION:  11/04/2013  PRIMARY CARE PHYSICIAN: Dr. Gavin Potters at [facility].   PRIMARY CARDIOLOGIST: Dr. Juliann Pares.  REFERRING EMERGENCY ROOM PHYSICIAN: Dr. Mayford Knife.   CHIEF COMPLAINT: Shortness of breath.   HISTORY OF PRESENT ILLNESS: This is a 81 year old male with past medical history of CHF, ejection fraction right around 45% to 50%, atrial fibrillation, hypertension, hyperlipidemia, cerebrovascular accident, who was in hospital almost 2 months ago and after that, he was discharged to a rehab center. From there, he came home 2 weeks ago. He was prescribed oxygen, but it took too long to arrange for oxygen at home. Finally, he got oxygen set up at home yesterday, supposed to take 2 liters oxygen all the time. The patient and his friend, also who is present in the room who is caretaker, as per them, for the last 2 to 3 days he has progressive worsening of his shortness of breath. He followed up with his primary care doctor a week ago, and they increased the dose of Lasix. They also increased dose of enalapril. The patient had seen Dr. Juliann Pares 2 weeks ago and he started him on digoxin, and he has been taking all the medications as prescribed regularly, except night before last night, he forgot to take all the medications at nighttime. For the last 2 to 3 days, he also noticed his legs are getting more and more swollen and they are somewhat red, finally decided to come to the Emergency Room and when he came, he was found having elevated BNP level with acute CHF exacerbation and so given to hospitalist team for admission after giving him IV Lasix.   REVIEW OF SYSTEMS:    CONSTITUTIONAL: Negative for fever. Positive for generalized weakness. No weight gain or weight loss.  EYES: No blurring or double vision, discharge or redness.  EARS, NOSE, THROAT: No tinnitus, ear pain, or hearing loss.  RESPIRATORY: The  patient has some shortness of breath. No cough, wheezing, or sputum production.  CARDIOVASCULAR: No chest pain. Has some edema on the legs. No arrhythmia or palpitations.  GASTROINTESTINAL: No nausea, vomiting, diarrhea, abdominal pain.  GENITOURINARY: No dysuria, hematuria, or increased frequency.  ENDOCRINE: No heat or cold intolerance. No excessive sweating.  SKIN: No acne, rashes, or lesions.  MUSCULOSKELETAL: No pain or swelling in the joints.  NEUROLOGICAL: No numbness, weakness, tremor, or vertigo.  PSYCHIATRIC: No anxiety, insomnia, bipolar disorder.   PAST MEDICAL HISTORY: 1.  CHF. Ejection fraction as per echocardiogram in April 2015 is 40% to 45%, severely  enlarged right ventricle and severely reduced right ventricular systolic function, severely dilated right atrium, moderately elevated pulmonary artery systolic pressure.  2.  Atrial fibrillation.  3.  Hypertension.  4.  Hyperlipidemia.  5.  Cerebrovascular accident, no leftover weakness.   SOCIAL HISTORY: Lives alone. Discharged from rehab 2 weeks ago. Has a caretaker and friend who is visiting him and taking care of him. No smoking or drinking or illicit drug use. His wife lived in assisted living and she died 2 months ago. He walks with a walker at home.   FAMILY HISTORY: No diabetes, stroke, or heart attacks in family.   PHYSICAL EXAMINATION: VITAL SIGNS: In the ER, temperature 97.5, pulse 78, respirations 18, blood pressure 134/73, and pulse oximetry 95 on 2 liters.  GENERAL: The patient is fully alert and oriented, slightly distressed due to respiratory failure.  HEENT: Head and neck  atraumatic. Conjunctivae pink. Oral mucosa moist.  NECK: Supple. Mild JVD present.  RESPIRATORY: Bilateral equal air entry. Crepitation present, bilateral.  CARDIOVASCULAR: S1, S2 present, regular, systolic murmur.  ABDOMEN: Soft, nontender. Bowel sounds present. No organomegaly.  SKIN: No rashes, acne, or lesions.  MUSCULOSKELETAL: No  pain or swelling in the joints.  NEUROLOGICAL: No numbness, weakness, tremor. Power 5/5. EXTREMITIES: Pitting edema present, bilateral legs. No redness of the skin.  PSYCHIATRIC: Does not appear in any acute psychiatric illness at this time.   IMPORTANT LABORATORY AND RADIOLOGICAL RESULTS: Glucose 87. BNP is 6618. BUN is 18, creatinine 1.34, sodium 136, potassium 3.5, chloride is 103, CO2 is 27. Troponin 0.09 and 0.07. WBC is 7000, hemoglobin 11.4, platelet count 106, and MCV is 93. INR is 3.9, and activated PTT is 63.7 Chest x-ray, portable single view: Mild CHF; fluid overload with stable mild cardiomegaly and mild diffuse interstitial pulmonary edema; bilateral pleural effusion, left greater than right; dense left lower lobe atelectasis and/or pneumonia; mild basilar atelectasis at right base.   ASSESSMENT AND PLAN: This 81 year old male with past history of congestive heart failure, ejection fraction 40% to 45%, atrial fibrillation, hypertension, and hyperlipidemia came to Emergency Room with complaint of worsening shortness of breath for the last 2 days and with leg edema.   ASSESSMENT AND PLAN: 1.  Acute systolic congestive heart failure: Will give him IV Lasix and monitor his input and output. Will call cardiology consult over the management of this issue. Echocardiogram was done recently, so no need to repeat it at this time.  2.  Atrial fibrillation: Currently, rate is under control. We will continue digoxin and carvedilol for rate control. He is on Coumadin. INR is more than 3, so we will hold his Coumadin at this time.  3.  Worsening renal function: Baseline creatinine is normal. Today it is a little worse. Maybe this is effect of his congestive heart failure or increased Lasix dose by PMD as outpatient. We will continue monitoring, but he currently needs Lasix so will continue that.  4.  Pneumonia: Finding of infiltrate on chest x-ray and has complaint of shortness of breath for the last 2  days. Will also give him Rocephin and azithromycin to treat that as well.  5.  Hypertension: Will give carvedilol.  6.  Hyperlipidemia: Will give atorvastatin.  7.  Code status is DO NOT RESUSCITATE, confirmed with the patient in presence of his friend, and his healthcare power of attorney is his son, Mr. Cloise Cambareri.   TOTAL TIME SPENT ON THIS ADMISSION: 50 minutes.   ____________________________ Hope Pigeon Elisabeth Pigeon, MD vgv:jcm D: 11/04/2013 16:57:08 ET T: 11/04/2013 18:02:06 ET JOB#: [id number]  cc: Hope Pigeon. Elisabeth Pigeon, MD, <Dictator> Dwayne D. Juliann Pares, MD Altamese Dilling MD ELECTRONICALLY SIGNED 11/09/2013 9:09

## 2014-09-16 NOTE — H&P (Signed)
PATIENT NAME:  Terry Huerta, Terry Huerta MR#:  [id number] DATE OF BIRTH:  May 02, 1920  DATE OF ADMISSION:  09/04/2013  PRIMARY CARE PHYSICIAN:  Dr. Gavin Potters   REFERRING PHYSICIAN:  Dr. Cyril Loosen  CHIEF COMPLAINT: Cough, shortness of breath, leg swelling for 3 days.   HISTORY OF PRESENT ILLNESS: A 81 year old Caucasian male with a history of CHF, AFib, hypertension, hyperlipidemia. Presented to the ED with above chief complaint. The patient is alert, awake, oriented, in no acute distress. The patient said he has had cough, sputum, shortness of breath for the past 3 days. In addition, patient has leg swelling and gained weight. The patient also has orthopnea and nocturnal dyspnea. The patient denies any fever or chills. Denies any other symptoms.   PAST MEDICAL HISTORY: CHF, ejection fraction more than 50%, AFib, hypertension, hyperlipidemia.   SOCIAL HISTORY: The patient lives alone. No smoking or drinking or illicit drugs. The patient's wife lived in assisted living, and died one week ago.  FAMILY HISTORY: No diabetes, stroke, or heart attack.   REVIEW OF SYSTEMS:    CONSTITUTIONAL: The patient denies any fever or chills. No headache or dizziness, but has weakness and weight gain.  EYES: No double vision or blurred vision.  EARS, NOSE, THROAT: No postnasal drip, slurred speech or dysphagia.  CARDIOVASCULAR: No chest pain, palpitation, but has orthopnea, nocturnal dyspnea, and leg edema.  PULMONARY: Positive for cough, sputum, shortness of breath, but no hemoptysis.  GASTROINTESTINAL: No abdominal pain, nausea, vomiting, diarrhea. No melena or bloody stool.  GENITOURINARY: No dysuria, hematuria, but has incontinence.  SKIN: No rash or jaundice.  NEUROLOGY: No syncope, loss of consciousness, or seizure.  HEMATOLOGY: No easy bleeding or bleeding.  ENDOCRINOLOGY: No polyuria, polydipsia, heat or cold intolerance.   VITAL SIGNS: Temperature 99.2, blood pressure 135/90, respirations 22, pulse 89, O2  saturation 96% on oxygen.   PHYSICAL EXAMINATION: GENERAL: The patient is alert, awake, oriented, in no acute distress.  HEENT: Pupils round, equal, react to light and accommodation. Moist oral mucosa, clear oropharynx. NECK: Supple. No JVD or carotid bruit. No lymphadenopathy. No thyromegaly.  CARDIOVASCULAR: S1, S2. Irregular rate and rhythm. No murmur or gallop.  PULMONARY: Bilateral air entry. No wheezing, but has rales on both sides. No use of accessory muscles to breathe.  ABDOMEN: Soft. No distention or tenderness. No organomegaly. Bowel sounds present.  EXTREMITIES: Bilateral leg edema, 2 to 3+, with chronic skin changes. No clubbing or cyanosis. No calf tenderness. Pedal pulses present.  SKIN: No rash or jaundice.  NEUROLOGY: Alert and oriented x 3. No focal deficit. Power 5/5. Sensation intact.   LABORATORY DATA: Glucose 153, BUN 25, creatinine 1.31, sodium 138, potassium 3.4, chloride 104, bicarb 27. Troponin 0.04. BNP 4807. X-ray showed cardiomegaly with interstitial pulmonary edema. INR 1.5. WBC 3.4, hemoglobin 12.6, platelets 73.   IMPRESSIONS: 1.  Acute on chronic diastolic congestive heart failure.  2.  Dehydration.  3.  Hypokalemia.  4.  Atrial fibrillation.  5.  Hypertension.  6.  Hyperlipidemia.  7.  Thrombocytopenia.   PLAN OF TREATMENT:  1.  The patient will be admitted to medical floor with telemonitor.  2.  Will start CHF protocol, get an echocardiogram, Lasix 40 mg IV b.i.d.  3.  Will continue Coreg, enalapril, aspirin, Coumadin.  4.  Will give potassium, follow up potassium level and magnesium level.   Discussed patient's condition and plan of treatment with the patient and patient's caregiver.   The patient wants DNR STATUS.   TIME SPENT:  About 58 minutes.    ____________________________ Shaune Pollack, MD qc:mr D: 09/04/2013 15:41:49 ET T: 09/04/2013 17:45:28 ET JOB#: [id number]  cc: Shaune Pollack, MD, <Dictator> Shaune Pollack MD ELECTRONICALLY SIGNED 09/05/2013  10:34

## 2014-09-16 NOTE — Consult Note (Signed)
PATIENT NAME:  Terry Huerta, Terry Huerta MR#:  [id number] DATE OF BIRTH:  Dec 18, 1919  DATE OF CONSULTATION:  11/04/2013  REFERRING PHYSICIAN:  Altamese Dilling, MD CONSULTING PHYSICIAN:  Marcina Millard, MD  PRIMARY CARE PHYSICIAN: Rolm Gala, MD  CARDIOLOGIST: Dorothyann Peng, MD.   REASON FOR CONSULTATION: Shortness of breath.   HISTORY OF PRESENT ILLNESS: The patient is a 81 year old gentleman who is admitted with congestive heart failure. The patient has a history of prior myocardial infarction, ischemic cardiomyopathy, systolic congestive heart failure, hypertension, and hyperlipidemia. The patient apparently was in his usual state of health until recently when he has been experiencing increasing peripheral edema and shortness of breath. Admission labs were notable for a B-type natriuretic peptide of 6618. Troponin was borderline elevated to 0.07. The patient denies chest pain. EKG was nondiagnostic.   PAST MEDICAL HISTORY: 1.  Myocardial infarction.  2.  Ischemic cardiomyopathy.  3.  Systolic congestive heart failure.  4.  Atrial fibrillation.  5.  Hypertension.  6.  Hyperlipidemia.  7.  Gastroesophageal reflux disease.   MEDICATIONS: Warfarin 5 mg on Friday and 6 mg every other day, enalapril 5 mg daily, aspirin 81 mg daily, atorvastatin 20 mg daily, carvedilol 6.25 mg daily, digoxin 0.125 mg daily, furosemide 40 mg 2 tablets b.i.d.   SOCIAL HISTORY: The patient is a widower. He currently lives alone. He has a remote tobacco abuse history.  FAMILY HISTORY: No immediate family history for coronary artery disease or myocardial infarction.  REVIEW OF SYSTEMS: CONSTITUTIONAL: No fever or chills.  EYES: No blurry vision.  EARS: No hearing loss.  RESPIRATORY: Shortness of breath as described above.  CARDIOVASCULAR: Shortness of breath and peripheral edema as described above. No chest pain.  GASTROINTESTINAL: No nausea, vomiting, or diarrhea.  GENITOURINARY: No dysuria or  hematuria.  ENDOCRINE: No polyuria or polydipsia.  MUSCULOSKELETAL: No arthralgias or myalgias.  NEUROLOGICAL: No focal muscle weakness or numbness.  PSYCHOLOGICAL: No depression or anxiety.   PHYSICAL EXAMINATION: VITAL SIGNS: Blood pressure 134/73, pulse 73, respirations 18, temperature 98.6, pulse ox 99%.  HEENT: Pupils equal and reactive to light and accommodation.  NECK: Supple without thyromegaly.  LUNGS: Decreased breath sounds.  HEART: Normal JVP. Diffuse PMI. Regular rate and rhythm. Normal S1, S2. Grade 2/6 mid peaking systolic murmur.  ABDOMEN: Soft and nontender. Pulses were intact bilaterally.  MUSCULOSKELETAL: Normal muscle tone.  NEUROLOGIC: The patient is alert and oriented x3. Motor and sensory are both grossly intact.   IMPRESSION: A 81 year old gentleman with known coronary artery disease, history of myocardial infarction, ischemic cardiomyopathy, systolic congestive heart failure, hyperlipidemia, hypertension and atrial fibrillation admitted with peripheral edema and shortness of breath consistent with congestive heart failure exacerbation. The patient is clinically improved after dose of intravenous Lasix and diuresis.   RECOMMENDATIONS: 1.  Agree with overall current therapy. 2.  Continue diuresis.  3.  Would defer further cardiac diagnostics at this time.  4.  Would defer full dose anticoagulation.  5.  Further recommendations pending the patient's initial clinical course. ____________________________ Marcina Millard, MD ap:sb D: 11/04/2013 16:06:12 ET T: 11/04/2013 16:19:26 ET JOB#: [id number]  cc: Marcina Millard, MD, <Dictator> Marcina Millard MD ELECTRONICALLY SIGNED 11/08/2013 8:31

## 2014-09-17 NOTE — Op Note (Signed)
PATIENT NAME:  Terry Huerta, Terry Huerta MR#:  [id number] DATE OF BIRTH:  Nov 02, 1919  DATE OF PROCEDURE:  11/25/2011  PREOPERATIVE DIAGNOSIS: Visually significant cataract of the left eye.   POSTOPERATIVE DIAGNOSIS: Visually significant cataract of the left eye.   OPERATIVE PROCEDURE: Cataract extraction by phacoemulsification with implant of intraocular lens to left eye.   SURGEON: Galen Manila, MD.   ANESTHESIA:  1. Managed anesthesia care.  2. Topical tetracaine drops followed by 2% Xylocaine jelly applied in the preoperative holding area.   COMPLICATIONS: None.   TECHNIQUE:  Stop and chop.  DESCRIPTION OF PROCEDURE: The patient was examined and consented in the preoperative holding area where the aforementioned topical anesthesia was applied to the left eye and then brought back to the Operating Room where the left eye was prepped and draped in the usual sterile ophthalmic fashion and a lid speculum was placed. A paracentesis was created with the side port blade and the anterior chamber was filled with viscoelastic. A near clear corneal incision was performed with the steel keratome. A continuous curvilinear capsulorrhexis was performed with a cystotome followed by the capsulorrhexis forceps. Hydrodissection and hydrodelineation were carried out with BSS on a blunt cannula. The lens was removed in a stop and chop technique and the remaining cortical material was removed with the irrigation-aspiration handpiece. The capsular bag was inflated with viscoelastic and the Alcon SN60WF 22.0-diopter lens, serial number [id number], was placed in the capsular bag without complication. The remaining viscoelastic was removed from the eye with the irrigation-aspiration handpiece. The wounds were hydrated. The anterior chamber was flushed with Miostat and the eye was inflated to physiologic pressure. The wounds were found to be water tight. The eye was dressed with Vigamox. The patient was given protective  glasses to wear throughout the day and a shield with which to sleep tonight. The patient was also given drops with which to begin a drop regimen today and will follow-up with me in one day.   ____________________________ Jerilee Field. Nayan Proch, MD wlp:cbb D: 11/25/2011 12:30:07 ET T: 11/25/2011 12:54:25 ET JOB#: [id number]  cc: Emoni Whitworth L. Twylia Oka, MD, <Dictator> Jerilee Field Martyna Thorns MD ELECTRONICALLY SIGNED 12/03/2011 9:58

## 2014-10-24 ENCOUNTER — Emergency Department

## 2014-10-24 ENCOUNTER — Inpatient Hospital Stay
Admission: EM | Admit: 2014-10-24 | Discharge: 2014-10-31 | DRG: 377 | Disposition: A | Discharge status: Skilled Nursing Facility | Attending: Internal Medicine | Admitting: Internal Medicine

## 2014-10-24 DIAGNOSIS — J9610 Chronic respiratory failure, unspecified whether with hypoxia or hypercapnia: Secondary | ICD-10-CM | POA: Diagnosis present

## 2014-10-24 DIAGNOSIS — I129 Hypertensive chronic kidney disease with stage 1 through stage 4 chronic kidney disease, or unspecified chronic kidney disease: Secondary | ICD-10-CM | POA: Diagnosis present

## 2014-10-24 DIAGNOSIS — K922 Gastrointestinal hemorrhage, unspecified: Secondary | ICD-10-CM | POA: Diagnosis present

## 2014-10-24 DIAGNOSIS — I482 Chronic atrial fibrillation: Secondary | ICD-10-CM | POA: Diagnosis present

## 2014-10-24 DIAGNOSIS — N170 Acute kidney failure with tubular necrosis: Secondary | ICD-10-CM | POA: Diagnosis present

## 2014-10-24 DIAGNOSIS — D638 Anemia in other chronic diseases classified elsewhere: Secondary | ICD-10-CM | POA: Diagnosis present

## 2014-10-24 DIAGNOSIS — D62 Acute posthemorrhagic anemia: Secondary | ICD-10-CM | POA: Diagnosis present

## 2014-10-24 DIAGNOSIS — Z66 Do not resuscitate: Secondary | ICD-10-CM | POA: Diagnosis present

## 2014-10-24 DIAGNOSIS — Z79899 Other long term (current) drug therapy: Secondary | ICD-10-CM | POA: Diagnosis not present

## 2014-10-24 DIAGNOSIS — F0390 Unspecified dementia without behavioral disturbance: Secondary | ICD-10-CM | POA: Diagnosis present

## 2014-10-24 DIAGNOSIS — E782 Mixed hyperlipidemia: Secondary | ICD-10-CM | POA: Diagnosis present

## 2014-10-24 DIAGNOSIS — D500 Iron deficiency anemia secondary to blood loss (chronic): Secondary | ICD-10-CM | POA: Diagnosis present

## 2014-10-24 DIAGNOSIS — Z7901 Long term (current) use of anticoagulants: Secondary | ICD-10-CM | POA: Diagnosis not present

## 2014-10-24 DIAGNOSIS — Z8673 Personal history of transient ischemic attack (TIA), and cerebral infarction without residual deficits: Secondary | ICD-10-CM | POA: Diagnosis not present

## 2014-10-24 DIAGNOSIS — K219 Gastro-esophageal reflux disease without esophagitis: Secondary | ICD-10-CM | POA: Diagnosis present

## 2014-10-24 DIAGNOSIS — B9681 Helicobacter pylori [H. pylori] as the cause of diseases classified elsewhere: Secondary | ICD-10-CM | POA: Diagnosis present

## 2014-10-24 DIAGNOSIS — M7989 Other specified soft tissue disorders: Secondary | ICD-10-CM | POA: Diagnosis present

## 2014-10-24 DIAGNOSIS — K2970 Gastritis, unspecified, without bleeding: Secondary | ICD-10-CM | POA: Diagnosis present

## 2014-10-24 DIAGNOSIS — I5023 Acute on chronic systolic (congestive) heart failure: Secondary | ICD-10-CM | POA: Diagnosis present

## 2014-10-24 DIAGNOSIS — L8990 Pressure ulcer of unspecified site, unspecified stage: Secondary | ICD-10-CM | POA: Diagnosis present

## 2014-10-24 DIAGNOSIS — I248 Other forms of acute ischemic heart disease: Secondary | ICD-10-CM | POA: Diagnosis present

## 2014-10-24 DIAGNOSIS — Z7982 Long term (current) use of aspirin: Secondary | ICD-10-CM | POA: Diagnosis not present

## 2014-10-24 DIAGNOSIS — N179 Acute kidney failure, unspecified: Secondary | ICD-10-CM | POA: Diagnosis present

## 2014-10-24 DIAGNOSIS — N183 Chronic kidney disease, stage 3 unspecified: Secondary | ICD-10-CM | POA: Insufficient documentation

## 2014-10-24 DIAGNOSIS — R609 Edema, unspecified: Secondary | ICD-10-CM

## 2014-10-24 DIAGNOSIS — I252 Old myocardial infarction: Secondary | ICD-10-CM

## 2014-10-24 DIAGNOSIS — R0602 Shortness of breath: Secondary | ICD-10-CM

## 2014-10-24 DIAGNOSIS — M25579 Pain in unspecified ankle and joints of unspecified foot: Secondary | ICD-10-CM

## 2014-10-24 DIAGNOSIS — M25473 Effusion, unspecified ankle: Secondary | ICD-10-CM

## 2014-10-24 HISTORY — DX: Reserved for inherently not codable concepts without codable children: IMO0001

## 2014-10-24 HISTORY — DX: Cardiac arrhythmia, unspecified: I49.9

## 2014-10-24 LAB — BRAIN NATRIURETIC PEPTIDE: B Natriuretic Peptide: 1355 pg/mL — ABNORMAL HIGH (ref 0.0–100.0)

## 2014-10-24 LAB — CBC
HCT: 14.5 % — CL (ref 40.0–52.0)
Hemoglobin: 4.9 g/dL — CL (ref 13.0–18.0)
MCH: 32.1 pg (ref 26.0–34.0)
MCHC: 33.8 g/dL (ref 32.0–36.0)
MCV: 95 fL (ref 80.0–100.0)
Platelets: 164 10*3/uL (ref 150–440)
RBC: 1.53 MIL/uL — ABNORMAL LOW (ref 4.40–5.90)
RDW: 15.7 % — AB (ref 11.5–14.5)
WBC: 5.4 10*3/uL (ref 3.8–10.6)

## 2014-10-24 LAB — HEMOGLOBIN: Hemoglobin: 7.3 g/dL — ABNORMAL LOW (ref 13.0–18.0)

## 2014-10-24 LAB — COMPREHENSIVE METABOLIC PANEL
ALT: 16 U/L — AB (ref 17–63)
AST: 38 U/L (ref 15–41)
Albumin: 2.6 g/dL — ABNORMAL LOW (ref 3.5–5.0)
Alkaline Phosphatase: 52 U/L (ref 38–126)
Anion gap: 11 (ref 5–15)
BILIRUBIN TOTAL: 0.6 mg/dL (ref 0.3–1.2)
BUN: 95 mg/dL — ABNORMAL HIGH (ref 6–20)
CO2: 28 mmol/L (ref 22–32)
Calcium: 8 mg/dL — ABNORMAL LOW (ref 8.9–10.3)
Chloride: 99 mmol/L — ABNORMAL LOW (ref 101–111)
Creatinine, Ser: 2.05 mg/dL — ABNORMAL HIGH (ref 0.61–1.24)
GFR calc Af Amer: 31 mL/min — ABNORMAL LOW (ref >60)
GFR, EST NON AFRICAN AMERICAN: 26 mL/min — AB (ref >60)
GLUCOSE: 127 mg/dL — AB (ref 65–99)
POTASSIUM: 3.9 mmol/L (ref 3.5–5.1)
Sodium: 138 mmol/L (ref 135–145)
Total Protein: 5.6 g/dL — ABNORMAL LOW (ref 6.5–8.1)

## 2014-10-24 LAB — PREPARE RBC (CROSSMATCH)

## 2014-10-24 LAB — TROPONIN I
Troponin I: 0.09 ng/mL — ABNORMAL HIGH (ref <0.031)
Troponin I: 0.09 ng/mL — ABNORMAL HIGH (ref <0.031)

## 2014-10-24 LAB — ABO/RH: ABO/RH(D): A POS

## 2014-10-24 LAB — PROTIME-INR
INR: 1.83
PROTHROMBIN TIME: 21.3 s — AB (ref 11.4–15.0)

## 2014-10-24 LAB — APTT: APTT: 37 s — AB (ref 24–36)

## 2014-10-24 LAB — GLUCOSE, CAPILLARY: Glucose-Capillary: 117 mg/dL — ABNORMAL HIGH (ref 65–99)

## 2014-10-24 MED ORDER — PANTOPRAZOLE SODIUM 40 MG IV SOLR: 40.0000 mg | Freq: Two times a day (BID) | INTRAVENOUS | Status: DC

## 2014-10-24 MED ORDER — SODIUM CHLORIDE 0.9 % IJ SOLN
3.0000 mL | Freq: Two times a day (BID) | INTRAMUSCULAR | Status: DC
  Administered 2014-10-25 – 2014-10-31 (×12): 3 mL via INTRAVENOUS

## 2014-10-24 MED ORDER — ACETAMINOPHEN 325 MG PO TABS: 650.0000 mg | ORAL_TABLET | Freq: Four times a day (QID) | ORAL | Status: DC | PRN

## 2014-10-24 MED ORDER — DIGOXIN 125 MCG PO TABS
0.1250 mg | ORAL_TABLET | ORAL | Status: DC
  Administered 2014-10-25 – 2014-10-31 (×4): 0.125 mg via ORAL
  Filled 2014-10-24 (×4): qty 1

## 2014-10-24 MED ORDER — SODIUM CHLORIDE 0.9 % IV SOLN
Freq: Once | INTRAVENOUS | Status: AC
  Administered 2014-10-24: 15:00:00 via INTRAVENOUS

## 2014-10-24 MED ORDER — PANTOPRAZOLE SODIUM 40 MG IV SOLR
80.0000 mg | Freq: Once | INTRAVENOUS | Status: AC
  Administered 2014-10-24: 80 mg via INTRAVENOUS
  Filled 2014-10-24: qty 80

## 2014-10-24 MED ORDER — ONDANSETRON HCL 4 MG/2ML IJ SOLN
4.0000 mg | Freq: Four times a day (QID) | INTRAMUSCULAR | Status: DC | PRN
Start: 2014-10-24 — End: 2014-10-31

## 2014-10-24 MED ORDER — SODIUM CHLORIDE 0.9 % IV SOLN: 1000.0000 mL | Freq: Once | INTRAVENOUS | Status: DC

## 2014-10-24 MED ORDER — SODIUM CHLORIDE 0.9 % IV SOLN
8.0000 mg/h | INTRAVENOUS | Status: DC
  Administered 2014-10-24 – 2014-10-27 (×7): 8 mg/h via INTRAVENOUS
  Filled 2014-10-24 (×7): qty 80

## 2014-10-24 MED ORDER — ALBUTEROL SULFATE (2.5 MG/3ML) 0.083% IN NEBU
2.5000 mg | INHALATION_SOLUTION | RESPIRATORY_TRACT | Status: DC | PRN
  Administered 2014-10-27 – 2014-10-29 (×4): 2.5 mg via RESPIRATORY_TRACT
  Filled 2014-10-24 (×5): qty 3

## 2014-10-24 MED ORDER — ACETAMINOPHEN 650 MG RE SUPP: 650.0000 mg | Freq: Four times a day (QID) | RECTAL | Status: DC | PRN

## 2014-10-24 MED ORDER — ONDANSETRON HCL 4 MG PO TABS: 4.0000 mg | ORAL_TABLET | Freq: Four times a day (QID) | ORAL | Status: DC | PRN

## 2014-10-24 MED ORDER — GUAIFENESIN-DM 100-10 MG/5ML PO SYRP: 5.0000 mL | ORAL_SOLUTION | ORAL | Status: DC | PRN

## 2014-10-24 MED ORDER — FUROSEMIDE 10 MG/ML IJ SOLN
20.0000 mg | Freq: Once | INTRAMUSCULAR | Status: AC
  Administered 2014-10-24: 20 mg via INTRAVENOUS

## 2014-10-24 MED ORDER — NITROGLYCERIN 0.4 MG SL SUBL: 0.4000 mg | SUBLINGUAL_TABLET | SUBLINGUAL | Status: DC | PRN

## 2014-10-24 MED ORDER — FUROSEMIDE 10 MG/ML IJ SOLN
INTRAMUSCULAR | Status: AC
  Administered 2014-10-24: 20 mg via INTRAVENOUS
  Filled 2014-10-24: qty 4

## 2014-10-24 MED ORDER — HYDROCODONE-ACETAMINOPHEN 5-325 MG PO TABS
1.0000 | ORAL_TABLET | ORAL | Status: DC | PRN
  Administered 2014-10-27: 2 via ORAL
  Filled 2014-10-24: qty 2

## 2014-10-24 MED ORDER — BUMETANIDE 0.25 MG/ML IJ SOLN
0.5000 mg | Freq: Once | INTRAMUSCULAR | Status: AC
  Administered 2014-10-24: 0.5 mg via INTRAVENOUS
  Filled 2014-10-24 (×2): qty 2

## 2014-10-24 NOTE — ED Notes (Signed)
Called pharmacy for protonix IV meds

## 2014-10-24 NOTE — Progress Notes (Addendum)
Notified by facility case manager that patient was being sent to the [facility] ED for evaluation of dark stools x 1 week, now accompanied by increased lower edema, shortness of breath and increased weakness. ED visit made. Mr. Terry Huerta is followed by [facility] with a hospice dx of CHF and is a DNR. DNR and facility [facility] were sent with patient and were present in his ED room. Patient seen lying in bed, alert and oriented x 4, oxygen on at 2 liters via nasal cannula, oxygen saturations 100%. He denied pain, did relate that he had been more short of breath and "dizzy" with exertion and he reported his edema to his lower extremities has been "worse". He appeared pale and had some dyspnea with conversation. Staff RN Connye Burkitt present during visit to draw labs, labs drawn, results pending, CXR pending, stool Guaiac positive. Writer spoke with attending Physician Dr. Cyril Huerta as well as patient's son Terry Huerta [phone]), patient to be admitted for evaluation of possible GI bleed. Of note patient has a PMH of A-fib and is currently on Eliquis. Per [facility], patient did take all his morning medications prior to ED transport. Writer spoke with [facility] Elnita Maxwell to advise that Mr. Terry Huerta is followed by [facility], Attending and staff RN also made aware. Hospice team updated. Will continue to follow. Dayna Barker RN, BSN, [facility] [place], [facility] [phone] c

## 2014-10-24 NOTE — ED Provider Notes (Signed)
[facility] Emergency Department Provider Note  ____________________________________________  Time seen: On arrival  I have reviewed the triage vital signs and the nursing notes.   HISTORY  Chief Complaint Shortness of Breath; Fatigue; Leg Swelling; and Melena      HPI Terry Huerta is a 81 y.o. male who presents with black stools for one week. He reports dizziness decreased energy and diffuse weakness. He is on eliquis for atrial fibrillation. He denies fevers chills. He denies chest pain. Hospice nurse notes low blood pressures which is unusual for him. Hospice nurse also notes that he is very pale. He denies abdominal pain. No history of GI bleed     Past Medical History  Diagnosis Date  . Hypertension   . Hyperlipidemia   . Stroke   . Myocardial infarction   . Atrial fibrillation   . Congestive heart failure   . GERD (gastroesophageal reflux disease)   . Chronic anticoagulation   . Hypokalemia   . Dementia   . Hypoxia     Patient Active Problem List   Diagnosis Date Noted  . A-fib 05/08/2014  . CCF (congestive cardiac failure) 05/08/2014  . Back pain, chronic 05/08/2014  . Concussion with < 1 hr loss of consciousness 05/08/2014  . Essential (primary) hypertension 05/08/2014  . Acid reflux 05/08/2014  . Hypercholesterolemia without hypertriglyceridemia 05/08/2014  . Heart attack 05/08/2014  . Cerebral vascular accident 05/08/2014  . Dementia, in, senility 03/29/2014  . Hypoxia 03/29/2014  . DNAR (do not attempt resuscitation) 11/14/2013  . Accumulation of fluid in tissues 11/02/2013  . History of anticoagulant therapy 03/25/2012  . Decreased potassium in the blood 03/25/2012    History reviewed. No pertinent past surgical history.  Current Outpatient Rx  Name  Route  Sig  Dispense  Refill  . apixaban (ELIQUIS) 2.5 MG TABS tablet   Oral   Take 2.5 mg by mouth 2 (two) times daily.         Marland Kitchen aspirin EC 81 MG tablet    Oral   Take 81 mg by mouth daily.         . carvedilol (COREG) 3.125 MG tablet   Oral   Take 1.5625 mg by mouth 2 (two) times daily with a meal.         . digoxin (LANOXIN) 0.125 MG tablet   Oral   Take 0.125 mg by mouth every other day. Pt takes every other day (on even days).         . enalapril (VASOTEC) 2.5 MG tablet   Oral   Take 2.5 mg by mouth daily.         Marland Kitchen guaiFENesin-dextromethorphan (ROBITUSSIN DM) 100-10 MG/5ML syrup   Oral   Take 5-10 mLs by mouth every 4 (four) hours as needed for cough.         . loratadine (CLARITIN) 10 MG tablet   Oral   Take 10 mg by mouth daily.         . Multiple Vitamins-Iron (MULTIVITAMINS WITH IRON) TABS tablet   Oral   Take 1 tablet by mouth daily.         . nitroGLYCERIN (NITROSTAT) 0.4 MG SL tablet   Sublingual   Place 0.4 mg under the tongue every 5 (five) minutes as needed for chest pain.         Marland Kitchen torsemide (DEMADEX) 20 MG tablet   Oral   Take 20 mg by mouth 2 (two) times daily.  Allergies Review of patient's allergies indicates no known allergies.  No family history on file.  Social History History  Substance Use Topics  . Smoking status: Never Smoker   . Smokeless tobacco: Not on file  . Alcohol Use: No    Review of Systems  Constitutional: Negative for fever. Eyes: Negative for visual changes. ENT: Negative for sore throat Cardiovascular: Negative for chest pain. Positive for dizziness Respiratory: Negative for shortness of breath.  Gastrointestinal: Negative for abdominal pain, vomiting and diarrhea. Positive for black stool Genitourinary: Negative for dysuria. Musculoskeletal: Negative for back pain. Skin: Negative for rash. Neurological: Negative for headaches or focal weakness   10-point ROS otherwise negative.  ____________________________________________   PHYSICAL EXAM:  VITAL SIGNS: ED Triage Vitals  Enc Vitals Group     BP 10/24/14 0942 92/37 mmHg     Pulse  Rate 10/24/14 0942 73     Resp 10/24/14 0942 22     Temp 10/24/14 0942 97.8 F (36.6 C)     Temp Source 10/24/14 0942 Oral     SpO2 10/24/14 0942 100 %     Weight 10/24/14 0942 215 lb 9.8 oz (97.8 kg)     Height 10/24/14 0942  (1.702 m)     Head Cir --      Peak Flow --      Pain Score --      Pain Loc --      Pain Edu? --      Excl. in GC? --      Constitutional: Alert and oriented. Well appearing and in no distress. Eyes: Conjunctivae are normal. PERRL. ENT   Head: Normocephalic and atraumatic.   Nose: No rhinnorhea.   Mouth/Throat: Mucous membranes are moist. Cardiovascular: Normal rate, regular rhythm. Normal and symmetric distal pulses are present in all extremities. No murmurs, rubs, or gallops. Respiratory: Normal respiratory effort without tachypnea nor retractions. Breath sounds are clear and equal bilaterally.  Gastrointestinal: Soft and non-tender in all quadrants. No distention. There is no CVA tenderness. Black sticky stool. Guaiac positive Genitourinary: deferred Musculoskeletal: Nontender with normal range of motion in all extremities. No lower extremity tenderness nor edema. Neurologic:  Normal speech and language. No gross focal neurologic deficits are appreciated. Skin:  Skin is warm, dry and intact. No rash noted. Psychiatric: Mood and affect are normal. Patient exhibits appropriate insight and judgment.  ____________________________________________    LABS (pertinent positives/negatives)  Labs Reviewed  CBC - Abnormal; Notable for the following:    RBC 1.53 (*)    Hemoglobin 4.9 (*)    HCT 14.5 (*)    RDW 15.7 (*)    All other components within normal limits  APTT - Abnormal; Notable for the following:    aPTT 37 (*)    All other components within normal limits  PROTIME-INR - Abnormal; Notable for the following:    Prothrombin Time 21.3 (*)    All other components within normal limits  TROPONIN I  COMPREHENSIVE METABOLIC PANEL   BRAIN NATRIURETIC PEPTIDE  TYPE AND SCREEN  PREPARE RBC (CROSSMATCH)    ____________________________________________   EKG  ED ECG REPORT I, Jene Every, the attending physician, personally viewed and interpreted this ECG.   Date: 10/24/2014  EKG Time: 9:38 AM  Rate: 81  Rhythm: atrial fibrillation, rate 81  Axis: Normal  Intervals:none  ST&T Change: Nonspecific   ____________________________________________    RADIOLOGY  Chest x-ray no acute distress  ____________________________________________   PROCEDURES  Procedure(s) performed: none  Critical Care performed: Yes CRITICAL CARE Performed by: Jene Every   Total critical care time:30 minutes  Critical care time was exclusive of separately billable procedures and treating other patients.  Critical care was necessary to treat or prevent imminent or life-threatening deterioration.  Critical care was time spent personally by me on the following activities: development of treatment plan with patient and/or surrogate as well as nursing, discussions with consultants, evaluation of patient's response to treatment, examination of patient, obtaining history from patient or surrogate, ordering and performing treatments and interventions, ordering and review of laboratory studies, ordering and review of radiographic studies, pulse oximetry and re-evaluation of patient's condition.   ____________________________________________   INITIAL IMPRESSION / ASSESSMENT AND PLAN / ED COURSE  Pertinent labs & imaging results that were available during my care of the patient were reviewed by me and considered in my medical decision making (see chart for details).  Patient is pale and with history of black stool and on eliquis concerning for GI bleed. Guaiac positive.  ----------------------------------------- 11:29 AM on 10/24/2014 -----------------------------------------  Hemoglobin noted to be 4.9 we will  transfuse the patient. Patient consented by me here in the emergency department  ____________________________________________   FINAL CLINICAL IMPRESSION(S) / ED DIAGNOSES  Final diagnoses:  Gastrointestinal hemorrhage, unspecified gastritis, unspecified gastrointestinal hemorrhage type     Jene Every, MD 10/24/14 1130

## 2014-10-24 NOTE — ED Notes (Signed)
Awaiting bed admission.  Pt resting in bed at this time.

## 2014-10-24 NOTE — H&P (Signed)
[facility] [place] [facility]   PATIENT NAME: Terry Huerta    MR#:  [id number]  DATE OF BIRTH:  [birth year]  DATE OF ADMISSION:  10/24/2014  PRIMARY CARE PHYSICIAN: Rolm Gala, MD   REQUESTING/REFERRING PHYSICIAN: Dr. Cyril Loosen  CHIEF COMPLAINT:   Chief Complaint  Patient presents with  . Shortness of Breath  . Fatigue  . Leg Swelling  . Melena    HISTORY OF PRESENT ILLNESS:  Terry Huerta  is a 81 y.o. male with a known history of atrial fibrillation on Eliquis, hypertension, chronic systolic CHF, anemia of chronic disease presents to the emergency room from his nursing home with fatigue. He has also had black tarry stools for 1 week which was confirmed in the emergency room. No frank blood. Blood pressure is low in systolic of 90s. Patient hasn't noticed any frank blood. No abdominal pain, nausea, vomiting. Not on any iron supplements. Never had upper GI endoscopy. Chronic shortness of breath.  PAST MEDICAL HISTORY:   Past Medical History  Diagnosis Date  . Hypertension   . Hyperlipidemia   . Stroke   . Myocardial infarction   . Atrial fibrillation   . Congestive heart failure   . GERD (gastroesophageal reflux disease)   . Chronic anticoagulation   . Hypokalemia   . Dementia   . Hypoxia     PAST SURGICAL HISTORY:  History reviewed. No pertinent past surgical history.  SOCIAL HISTORY:   History  Substance Use Topics  . Smoking status: Never Smoker   . Smokeless tobacco: Not on file  . Alcohol Use: No    FAMILY HISTORY:  History reviewed. No pertinent family history.  DRUG ALLERGIES:  No Known Allergies  REVIEW OF SYSTEMS:   Review of Systems  Constitutional: Positive for malaise/fatigue. Negative for fever, chills and weight loss.  HENT: Negative for hearing loss and nosebleeds.   Eyes: Negative for blurred vision, double vision and pain.  Respiratory: Positive for shortness of breath (chronic). Negative for cough,  hemoptysis, sputum production and wheezing.   Cardiovascular: Negative for chest pain, palpitations, orthopnea and leg swelling.  Gastrointestinal: Positive for melena. Negative for nausea, vomiting, abdominal pain, diarrhea and constipation.  Genitourinary: Negative for dysuria and hematuria.  Musculoskeletal: Negative for myalgias, back pain and falls.  Skin: Negative for rash.  Neurological: Positive for weakness. Negative for dizziness, tremors, sensory change, speech change, focal weakness, seizures and headaches.  Endo/Heme/Allergies: Bruises/bleeds easily (on eliquis).  Psychiatric/Behavioral: Negative for depression and memory loss. The patient is not nervous/anxious.     MEDICATIONS AT HOME:   Prior to Admission medications   Medication Sig Start Date End Date Taking? Authorizing Provider  apixaban (ELIQUIS) 2.5 MG TABS tablet Take 2.5 mg by mouth 2 (two) times daily.   Yes Historical Provider, MD  aspirin EC 81 MG tablet Take 81 mg by mouth daily.   Yes Historical Provider, MD  carvedilol (COREG) 3.125 MG tablet Take 1.5625 mg by mouth 2 (two) times daily with a meal.   Yes Historical Provider, MD  digoxin (LANOXIN) 0.125 MG tablet Take 0.125 mg by mouth every other day. Pt takes every other day (on even days).   Yes Historical Provider, MD  enalapril (VASOTEC) 2.5 MG tablet Take 2.5 mg by mouth daily.   Yes Historical Provider, MD  guaiFENesin-dextromethorphan (ROBITUSSIN DM) 100-10 MG/5ML syrup Take 5-10 mLs by mouth every 4 (four) hours as needed for cough.   Yes Historical Provider, MD  loratadine (CLARITIN) 10  MG tablet Take 10 mg by mouth daily.   Yes Historical Provider, MD  Multiple Vitamins-Iron (MULTIVITAMINS WITH IRON) TABS tablet Take 1 tablet by mouth daily.   Yes Historical Provider, MD  nitroGLYCERIN (NITROSTAT) 0.4 MG SL tablet Place 0.4 mg under the tongue every 5 (five) minutes as needed for chest pain.   Yes Historical Provider, MD  torsemide (DEMADEX) 20 MG  tablet Take 20 mg by mouth 2 (two) times daily.   Yes Historical Provider, MD      VITAL SIGNS:  Blood pressure 90/45, pulse 72, temperature 97.8 F (36.6 C), temperature source Oral, resp. rate 14, height 5\' 7"  (1.702 m), weight 97.8 kg (215 lb 9.8 oz), SpO2 100 %.  PHYSICAL EXAMINATION:  Physical Exam  GENERAL:  81 y.o.-year-old patient lying in the bed with no acute distress. Obese  EYES: Pupils equal, round, reactive to light and accommodation. No scleral icterus. Extraocular muscles intact.  HEENT: Head atraumatic, normocephalic. Oropharynx and nasopharynx clear. No oropharyngeal erythema, moist oral mucosa . Pale NECK:  Supple, no jugular venous distention. No thyroid enlargement, no tenderness.  LUNGS: Normal breath sounds bilaterally, no wheezing, rhonchi. No use of accessory muscles of respiration. Crackles. CARDIOVASCULAR: S1, S2 normal. No murmurs, rubs, or gallops.  ABDOMEN: Soft, nontender, nondistended. Bowel sounds present. No organomegaly or mass.  EXTREMITIES: No pedal edema, cyanosis, or clubbing. + 2 pedal & radial pulses b/l.   NEUROLOGIC: Cranial nerves II through XII are intact. No focal Motor or sensory deficits appreciated b/l PSYCHIATRIC: The patient is alert and oriented x 3. Good affect.  SKIN: No obvious rash, lesion, or ulcer.   LABORATORY PANEL:   CBC  Recent Labs Lab 10/24/14 0948  WBC 5.4  HGB 4.9*  HCT 14.5*  PLT 164   ------------------------------------------------------------------------------------------------------------------  Chemistries   Recent Labs Lab 10/24/14 0948  NA 138  K 3.9  CL 99*  CO2 28  GLUCOSE 127*  BUN 95*  CREATININE 2.05*  CALCIUM 8.0*  AST 38  ALT 16*  ALKPHOS 52  BILITOT 0.6   ------------------------------------------------------------------------------------------------------------------  Cardiac Enzymes  Recent Labs Lab 10/24/14 0948  TROPONINI 0.09*    ------------------------------------------------------------------------------------------------------------------  RADIOLOGY:  Dg Chest Portable 1 View  10/24/2014   CLINICAL DATA:  Weakness, fatigue and bilateral leg swelling for 1 week. Shortness of breath. History of atrial fibrillation and stroke.  EXAM: PORTABLE CHEST - 1 VIEW  COMPARISON:  08/26/2014 and 11/06/2013.  FINDINGS: 1022 hr. Cardiomegaly and mild vascular congestion appear unchanged. There is no overt pulmonary edema, confluent airspace opacity or significant pleural effusion. The bones appear unchanged. Telemetry leads overlie the chest.  IMPRESSION: Stable chronic cardiomegaly and mild vascular congestion. No acute findings demonstrated.   Electronically Signed   By: Carey Bullocks M.D.   On: 10/24/2014 10:55     IMPRESSION AND PLAN:   24 m with Afib on Eliquis, Systolic chf, HTN here with black stools  * Upper GI bleed with Acute blood loss anemia and hypotension On eliquis. STOP. Start protonix drip Stat PRBC- 4 units GI Consult. Discussed with Dr. Marva Panda Serial Hb  * Hypotension due to volume loss Replete with blood  * ARF over CKD3 due to ATN from hypotension Volume replacement, Monitor I/Os. Check BUN/Cr in AM.  * Chronic afib Continue digoxin. Hold coreg. Hold eliquis.  * h/o CVA No deficits Eliquis held due to GI bleed  * Chronic systolic chf No signs of fluid overload. Has chronic resp failure Monitor for fluid overload  *  HTN- Meds held  * Troponin elevation likely from demand ischemia with severe anemia, systolic chf in setting of ARF. Monitor. Will repeat in 4 hrs.  Critically ill with high risk for cardiac arrest and death.  All the records are reviewed and case discussed with ED provider. Management plans discussed with the patient, family and they are in agreement.  CODE STATUS: DNR/DNI  TOTAL CRITICAL CARE TIME TAKING CARE OF THIS PATIENT: 50 minutes.    Milagros Loll R  M.D on 10/24/2014 at 11:50 AM  Between 7am to 6pm - Pager - [phone]  After 6pm go to [email or web address] - password [email or web address] [facility]  [facility]  Office  [phone]  CC: Primary care physician; Rolm Gala, MD

## 2014-10-24 NOTE — ED Notes (Signed)
AT bedside with EDP for rectal exam

## 2014-10-24 NOTE — ED Notes (Signed)
To ED via [facility] from [facility] for c/o 1 week of weakness, fatigue, bilateral leg swelling, black and tarry stools and SOB. Pt arrives on 2L [place]. Pt at baseline per facility.

## 2014-10-24 NOTE — Consult Note (Signed)
GI Inpatient Consult Note Christena Deem MD  Reason for Consult: Black stools/melana GI bleeding   Attending Requesting Consult:Dr Sudini  Outpatient Primary Physician: Dr. Gavin Potters  History of Present Illness: Terry Huerta is a 81 y.o. male came to the emergency room from his nursing home complaint of fatigue. He does have a asked history of anemia of chronic disease ever he was found to be profoundly anemic. He is been seeing some black stools or up to perhaps a week prior to coming to the hospital. His had no abdominal pain or emesis. He has had some recent mild nausea perhaps for the past couple of days, there is been no vomiting. No previous history of peptic ulcer disease. He has had a little bit of heartburn recently no dysphagia. His appetite is been decreased for a couple days. His had some recent increase of constipation and despite seeing the black stools has been having only perhaps 1 stool daily. He has been on Eliquis due to his history of atrial fibrillation. Medications currently held. The last dose of Eliquis was this morning and his last bowel movement was this morning as well.  He has been a hospice patient about 18 months due to his chronic illnesses.  Past Medical History:  Past Medical History  Diagnosis Date  . Hypertension   . Hyperlipidemia   . Stroke   . Myocardial infarction   . Atrial fibrillation   . Congestive heart failure   . GERD (gastroesophageal reflux disease)   . Chronic anticoagulation   . Hypokalemia   . Dementia   . Hypoxia   . Dysrhythmia   . Shortness of breath dyspnea     Problem List: Patient Active Problem List   Diagnosis Date Noted  . GI bleed 10/24/2014  . Acute blood loss anemia 10/24/2014  . Acute renal failure 10/24/2014  . CKD (chronic kidney disease) stage 3, GFR 30-59 ml/min 10/24/2014  . A-fib 05/08/2014  . CCF (congestive cardiac failure) 05/08/2014  . Back pain, chronic 05/08/2014  . Concussion with < 1 hr  loss of consciousness 05/08/2014  . Essential (primary) hypertension 05/08/2014  . Acid reflux 05/08/2014  . Hypercholesterolemia without hypertriglyceridemia 05/08/2014  . Heart attack 05/08/2014  . Cerebral vascular accident 05/08/2014  . Dementia, in, senility 03/29/2014  . Hypoxia 03/29/2014  . DNAR (do not attempt resuscitation) 11/14/2013  . Accumulation of fluid in tissues 11/02/2013  . History of anticoagulant therapy 03/25/2012  . Decreased potassium in the blood 03/25/2012    Past Surgical History: History reviewed. No pertinent past surgical history.  Allergies: No Known Allergies  Home Medications: Prescriptions prior to admission  Medication Sig Dispense Refill Last Dose  . apixaban (ELIQUIS) 2.5 MG TABS tablet Take 2.5 mg by mouth 2 (two) times daily.   10/24/2014 at 0800  . aspirin EC 81 MG tablet Take 81 mg by mouth daily.   10/24/2014 at 0800  . carvedilol (COREG) 3.125 MG tablet Take 1.5625 mg by mouth 2 (two) times daily with a meal.   10/24/2014 at 0800  . digoxin (LANOXIN) 0.125 MG tablet Take 0.125 mg by mouth every other day. Pt takes every other day (on even days).   10/23/2014 at 0800  . enalapril (VASOTEC) 2.5 MG tablet Take 2.5 mg by mouth daily.   10/24/2014 at 0800  . guaiFENesin-dextromethorphan (ROBITUSSIN DM) 100-10 MG/5ML syrup Take 5-10 mLs by mouth every 4 (four) hours as needed for cough.   PRN at PRN  . loratadine (  CLARITIN) 10 MG tablet Take 10 mg by mouth daily.   10/24/2014 at 0800  . Multiple Vitamins-Iron (MULTIVITAMINS WITH IRON) TABS tablet Take 1 tablet by mouth daily.   10/24/2014 at 0800  . nitroGLYCERIN (NITROSTAT) 0.4 MG SL tablet Place 0.4 mg under the tongue every 5 (five) minutes as needed for chest pain.   PRN at PRN  . torsemide (DEMADEX) 20 MG tablet Take 20 mg by mouth 2 (two) times daily.   10/24/2014 at 0800   Home medication reconciliation was completed with the patient.   Scheduled Inpatient Medications:   . bumetanide (BUMEX) IV   0.5 mg Intravenous Once  . [START ON 10/25/2014] digoxin  0.125 mg Oral QODAY  . sodium chloride  3 mL Intravenous Q12H    Continuous Inpatient Infusions:   . pantoprozole (PROTONIX) infusion 8 mg/hr (10/24/14 1330)    PRN Inpatient Medications:  acetaminophen **OR** acetaminophen, albuterol, guaiFENesin-dextromethorphan, HYDROcodone-acetaminophen, nitroGLYCERIN, ondansetron **OR** ondansetron (ZOFRAN) IV  Family History: family history is not on file.   GI Family History: Noncontributory  Social History:   reports that he has never smoked. He does not have any smokeless tobacco history on file. He reports that he does not drink alcohol or use illicit drugs. The patient denies ETOH, tobacco, or drug use.   ROS  Review of Systems: 10 systems reviewed per admission history and physical, agree with same  Physical Examination: BP 97/51 mmHg  Pulse 78  Temp(Src) 97.7 F (36.5 C) (Oral)  Resp 14  Ht  (1.702 m)  Wt 97.8 kg (215 lb 9.8 oz)  BMI 33.76 kg/m2  SpO2 100% Gen: Elderly-appearing Caucasian male no acute distress HEENT: Normocephalic atraumatic eyes are anicteric Neck: No JVD  Chest: Clear to auscultation CV: Irregular rate and rhythm  Abd: Soft nontender nondistended bowel sounds are positive and normoactive no masses rebound or organomegaly Ext: 2-3+ lower extremity edema consistent with anasarca. 6 stents proximally over his lower back to the level of the lower costal margin. Skin: Other:  Data: Lab Results  Component Value Date   WBC 5.4 10/24/2014   HGB 4.9* 10/24/2014   HCT 14.5* 10/24/2014   MCV 95.0 10/24/2014   PLT 164 10/24/2014    Recent Labs Lab 10/24/14 0948  HGB 4.9*   Lab Results  Component Value Date   NA 138 10/24/2014   K 3.9 10/24/2014   CL 99* 10/24/2014   CO2 28 10/24/2014   BUN 95* 10/24/2014   CREATININE 2.05* 10/24/2014   Lab Results  Component Value Date   ALT 16* 10/24/2014   AST 38 10/24/2014   ALKPHOS 52  10/24/2014   BILITOT 0.6 10/24/2014    Recent Labs Lab 10/24/14 0948  APTT 37*  INR 1.83   CBC Latest Ref Rng 10/24/2014 11/06/2013 11/04/2013  WBC 3.8 - 10.6 K/uL 5.4 5.4 7.0  Hemoglobin 13.0 - 18.0 g/dL 4.9(LL) 10.8(L) 11.4(L)  Hematocrit 40.0 - 52.0 % 14.5(LL) 32.5(L) 33.8(L)  Platelets 150 - 440 K/uL 164 97(L) 106(L)    STUDIES: Dg Chest Portable 1 View  10/24/2014   CLINICAL DATA:  Weakness, fatigue and bilateral leg swelling for 1 week. Shortness of breath. History of atrial fibrillation and stroke.  EXAM: PORTABLE CHEST - 1 VIEW  COMPARISON:  08/26/2014 and 11/06/2013.  FINDINGS: 1022 hr. Cardiomegaly and mild vascular congestion appear unchanged. There is no overt pulmonary edema, confluent airspace opacity or significant pleural effusion. The bones appear unchanged. Telemetry leads overlie the chest.  IMPRESSION: Stable  chronic cardiomegaly and mild vascular congestion. No acute findings demonstrated.   Electronically Signed   By: Carey Bullocks M.D.   On: 10/24/2014 10:55   @  Assessment: Melena. Patient is hemodynamically stable and essentially asymptomatic the exception of the fatigue. I did contact his son and family and patient wishes to avoid invasive/sedated procedures if at all possible.  Patient is currently undergoing multiple transfusions of packed red cells. He is currently on a proton pump inhibitor IV.  Recommendations: Continue IV protonix as you are. Transfuse as you are. Check a Helicobacter pylori serology. No plans for sedated procedure this point.  Thank you for the consult. Please call with questions or concerns.  Christena Deem, MD  10/24/2014 8:14 PM

## 2014-10-24 NOTE — ED Notes (Signed)
Pt on eliquis

## 2014-10-24 NOTE — ED Notes (Signed)
Pt assisted to toilet.  Pt needed 2 person assist.

## 2014-10-25 DIAGNOSIS — L8990 Pressure ulcer of unspecified site, unspecified stage: Secondary | ICD-10-CM | POA: Insufficient documentation

## 2014-10-25 LAB — CBC
HCT: 25.1 % — ABNORMAL LOW (ref 40.0–52.0)
Hemoglobin: 8.8 g/dL — ABNORMAL LOW (ref 13.0–18.0)
MCH: 32 pg (ref 26.0–34.0)
MCHC: 34.9 g/dL (ref 32.0–36.0)
MCV: 91.7 fL (ref 80.0–100.0)
Platelets: 144 10*3/uL — ABNORMAL LOW (ref 150–440)
RBC: 2.74 MIL/uL — ABNORMAL LOW (ref 4.40–5.90)
RDW: 15.4 % — ABNORMAL HIGH (ref 11.5–14.5)
WBC: 5 10*3/uL (ref 3.8–10.6)

## 2014-10-25 LAB — BASIC METABOLIC PANEL
Anion gap: 8 (ref 5–15)
BUN: 88 mg/dL — ABNORMAL HIGH (ref 6–20)
CO2: 29 mmol/L (ref 22–32)
Calcium: 7.8 mg/dL — ABNORMAL LOW (ref 8.9–10.3)
Chloride: 104 mmol/L (ref 101–111)
Creatinine, Ser: 1.97 mg/dL — ABNORMAL HIGH (ref 0.61–1.24)
GFR calc Af Amer: 32 mL/min — ABNORMAL LOW (ref >60)
GFR calc non Af Amer: 28 mL/min — ABNORMAL LOW (ref >60)
Glucose, Bld: 97 mg/dL (ref 65–99)
Potassium: 3.7 mmol/L (ref 3.5–5.1)
Sodium: 141 mmol/L (ref 135–145)

## 2014-10-25 NOTE — Clinical Social Work Note (Signed)
Clinical Social Work Assessment  Patient Details  Name: Terry Huerta MRN: [id number] Date of Birth: 05-16-1920  Date of referral:  10/25/14               Reason for consult:  Facility Placement                Permission sought to share information with:    Permission granted to share information::     Name::        Agency::     Relationship::     Contact Information:     Housing/Transportation Living arrangements for the past 2 months:  Assisted Living Facility Source of Information:  Adult Children Patient Interpreter Needed:  None Criminal Activity/Legal Involvement Pertinent to Current Situation/Hospitalization:  No - Comment as needed Significant Relationships:  Adult Children Lives with:    Do you feel safe going back to the place where you live?    Need for family participation in patient care:  No (Coment)  Care giving concerns:  None   Office manager / plan:  CSW attempted to see patient this morning but he was sleeping soundly. CSW contacted patient's son: Rakin Corkran: [phone]. Mr. Buras confirmed that patient  Is a resident at [facility] and that he is followed by [facility]. Mr. Khera confirmed that he wishes for patient to return to [facility] with Hospice and that he will more than likely transport him when time but he will evaluate his schedule when he is ready for discharge. CSW has spoken to [place] [facility] and she is able to accept patient back when time. FL2 placed on chart.   Employment status:  Retired Health and safety inspector:  Other (Comment Required) (hospice ) PT Recommendations:  Not assessed at this time Information / Referral to community resources:     Patient/Family's Response to care:  Accepting  Patient/Family's Understanding of and Emotional Response to Diagnosis, Current Treatment, and Prognosis:  Patient's son verbalized understanding and agreement with plan to return to [facility] with hospice when  time.   Emotional Assessment Appearance:  Appears stated age Attitude/Demeanor/Rapport:    Affect (typically observed):    Orientation:    Alcohol / Substance use:  Not Applicable Psych involvement (Current and /or in the community):  No (Comment)  Discharge Needs  Concerns to be addressed:  No discharge needs identified Readmission within the last 30 days:  No Current discharge risk:  None Barriers to Discharge:  No Barriers Identified   York Spaniel, LCSW 10/25/2014, 12:08 PM

## 2014-10-25 NOTE — Progress Notes (Signed)
Visit made. Patient seen lying in bed, alert and oriented, color much improved over ED visit yesterday. He is s/p 4 units of blood, hemoglibin up to 8.8 per lab report this morning. Blood pressure during visit 101/62. Patient reports his son came to visit yesterday and was returning today. He remains NPO, per GI consult note current plan is to continue IV Protonix and check a Helicobacter pylori serology. Will continue to follow. Dayna Barker RN, BSN, [facility] [place], [facility] [phone]

## 2014-10-25 NOTE — Progress Notes (Signed)
Per dr Luberta Mutter pt to transfer to 2a

## 2014-10-25 NOTE — Progress Notes (Signed)
[facility] [place] at [facility]   PATIENT NAME: Terry Huerta    MR#:  [id number]  DATE OF BIRTH:  02-01-1920  SUBJECTIVE 81 year old male patient admitted to ICU secondary to upper GI bleed.  Having melena for about 1 week. Patient found to have severely anemic with hemoglobin of 4. Received 4 units of blood transfusion. Hemoglobin this morning is 8.7. Patient did not have further episodes the Melena. Dr. Marva Panda  spoke with patient's son extensively about endoscopy patient's son did not want a EGD procedure.   CHIEF COMPLAINT:   Chief Complaint  Patient presents with  . Shortness of Breath  . Fatigue  . Leg Swelling  . Melena    REVIEW OF SYSTEMS:    Review of Systems  Constitutional: Negative for fever and chills.  HENT: Negative for hearing loss.   Eyes: Negative for blurred vision, double vision and photophobia.  Respiratory: Negative for cough, hemoptysis and shortness of breath.   Cardiovascular: Negative for palpitations, orthopnea and leg swelling.  Gastrointestinal: Negative for vomiting, abdominal pain and diarrhea.  Genitourinary: Negative for dysuria and urgency.  Musculoskeletal: Negative for myalgias and neck pain.  Skin: Negative for rash.  Neurological: Negative for dizziness, focal weakness, seizures, weakness and headaches.  Psychiatric/Behavioral: Negative for memory loss. The patient does not have insomnia.     Nutrition: diet  Tolerating Diet: Tolerating PT:      DRUG ALLERGIES:  No Known Allergies  VITALS:  Blood pressure 119/65, pulse 84, temperature 98 F (36.7 C), temperature source Oral, resp. rate 17, height  (1.702 m), weight 96.7 kg (213 lb 3 oz), SpO2 97 %.  PHYSICAL EXAMINATION:   Physical Exam  GENERAL:  81 y.o.-year-old patient lying in the bed with no acute distress.  EYES: Pupils equal, round, reactive to light and accommodation. No scleral icterus. Extraocular muscles intact.  HEENT: Head  atraumatic, normocephalic. Oropharynx and nasopharynx clear.  NECK:  Supple, no jugular venous distention. No thyroid enlargement, no tenderness.  LUNGS: Normal breath sounds bilaterally, no wheezing, rales,rhonchi or crepitation. No use of accessory muscles of respiration.  CARDIOVASCULAR: S1, S2 normal. No murmurs, rubs, or gallops.  ABDOMEN: Soft, nontender, nondistended. Bowel sounds present. No organomegaly or mass.  EXTREMITIES: No pedal edema, cyanosis, or clubbing.  NEUROLOGIC: Cranial nerves II through XII are intact. Muscle strength 5/5 in all extremities. Sensation intact. Gait not checked.  PSYCHIATRIC: The patient is alert and oriented x 3.  SKIN: No obvious rash, lesion, or ulcer.    LABORATORY PANEL:   CBC  Recent Labs Lab 10/25/14 0625  WBC 5.0  HGB 8.8*  HCT 25.1*  PLT 144*   ------------------------------------------------------------------------------------------------------------------  Chemistries   Recent Labs Lab 10/24/14 0948 10/25/14 0625  NA 138 141  K 3.9 3.7  CL 99* 104  CO2 28 29  GLUCOSE 127* 97  BUN 95* 88*  CREATININE 2.05* 1.97*  CALCIUM 8.0* 7.8*  AST 38  --   ALT 16*  --   ALKPHOS 52  --   BILITOT 0.6  --    ------------------------------------------------------------------------------------------------------------------  Cardiac Enzymes  Recent Labs Lab 10/24/14 1645  TROPONINI 0.09*   ------------------------------------------------------------------------------------------------------------------  RADIOLOGY:  Dg Chest Portable 1 View  10/24/2014   CLINICAL DATA:  Weakness, fatigue and bilateral leg swelling for 1 week. Shortness of breath. History of atrial fibrillation and stroke.  EXAM: PORTABLE CHEST - 1 VIEW  COMPARISON:  08/26/2014 and 11/06/2013.  FINDINGS: 1022 hr. Cardiomegaly and mild vascular congestion  appear unchanged. There is no overt pulmonary edema, confluent airspace opacity or significant pleural  effusion. The bones appear unchanged. Telemetry leads overlie the chest.  IMPRESSION: Stable chronic cardiomegaly and mild vascular congestion. No acute findings demonstrated.   Electronically Signed   By: Carey Bullocks M.D.   On: 10/24/2014 10:55     ASSESSMENT AND PLAN:   1. Upper GI bleed with acute blood loss anemia and hypotension likely precipitated by her liquids. Hold eliquis., continue Protonix drip for 72 hours. Watch for further evidence of bleeding, monitor in intensive care unit closely check CBC every 12 hours. No endoscopy is planned as per discussion with patient's son. Start him on clear liquids today.d/w GI 2, hypotension secondary to blood loss anemia resolved. Acute renal failure with CK distress 3 due to ATN from hypotension resolved Chronic atrial fibrillation patient Eliquis is  stopped  Due to GI bleed.consult cardiology regarding anticoagulation due to ongoing GI bleed.hold coreg due to hypotension History of CVA ;no deficits  CODE STATUS DO NOT RESUSCITATE   All the records are reviewed and case discussed with Care Management/Social Workerr. Management plans discussed with the patient, family and they are in agreement.  CODE STATUS:full  TOTAL TIME TAKING CARE OF THIS PATIENT: .Critical care time  POSSIBLE D/C IN 3-4  DAYS, DEPENDING ON CLINICAL CONDITION.   Katha Hamming M.D on 10/25/2014 at 11:51 AM  Between 7am to 6pm - Pager - [phone]  After 6pm go to [email or web address] - password [email or web address] [facility]  [place] [facility]  Office  [phone]  CC: Primary care physician; Rolm Gala, MD

## 2014-10-25 NOTE — Progress Notes (Signed)
s/w Dr. Luberta Mutter, pt to remain in ICU in stepdown status at this time. Pt to continue on protonix gtt for 72 hours. hgb to be rechecked 10/26/14 at 0500. Diet advanced to clear liquids.

## 2014-10-25 NOTE — Consult Note (Signed)
Subjective: Patient seen for melena. No bowel movement since admission. Patient tolerated transfusion. Denies abdominal pain or nausea. Currently on IV PPI tolerating clear liquids  Objective: Vital signs in last 24 hours: Temp:  [97.3 F (36.3 C)-98 F (36.7 C)] 97.5 F (36.4 C) (06/01 1628) Pulse Rate:  [71-132] 76 (06/01 1628) Resp:  [8-29] 18 (06/01 1628) BP: (70-119)/(38-89) 97/40 mmHg (06/01 1628) SpO2:  [86 %-100 %] 100 % (06/01 1628) Weight:  [96.7 kg (213 lb 3 oz)] 96.7 kg (213 lb 3 oz) (06/01 0527) Blood pressure 97/40, pulse 76, temperature 97.5 F (36.4 C), temperature source Oral, resp. rate 18, height 5\' 7"  (1.702 m), weight 96.7 kg (213 lb 3 oz), SpO2 100 %.   Intake/Output from previous day: 05/31 0701 - 06/01 0700 In: 1903.3 [I.V.:65; Blood:1738.3; IV Piggyback:100] Out: 1200 [Urine:1200]  Intake/Output this shift: Total I/O In: 203 [I.V.:203] Out: 575 [Urine:575]   General appearance:  A 81 year old male no acute distress Resp:  Clear to auscultation Cardio:  Regular rate and rhythm GI:  Soft nontender nondistended bowel sounds positive normoactive no masses rebound or organomegaly Extremities:  2-3+ edema   Lab Results: Results for orders placed or performed during the hospital encounter of 10/24/14 (from the past 24 hour(s))  Hemoglobin     Status: Abnormal   Collection Time: 10/24/14 10:26 PM  Result Value Ref Range   Hemoglobin 7.3 (L) 13.0 - 18.0 g/dL  Basic metabolic panel     Status: Abnormal   Collection Time: 10/25/14  6:25 AM  Result Value Ref Range   Sodium 141 135 - 145 mmol/L   Potassium 3.7 3.5 - 5.1 mmol/L   Chloride 104 101 - 111 mmol/L   CO2 29 22 - 32 mmol/L   Glucose, Bld 97 65 - 99 mg/dL   BUN 88 (H) 6 - 20 mg/dL   Creatinine, Ser 1.61 (H) 0.61 - 1.24 mg/dL   Calcium 7.8 (L) 8.9 - 10.3 mg/dL   GFR calc non Af Amer 28 (L) >60 mL/min   GFR calc Af Amer 32 (L) >60 mL/min   Anion gap 8 5 - 15  CBC     Status: Abnormal   Collection Time: 10/25/14  6:25 AM  Result Value Ref Range   WBC 5.0 3.8 - 10.6 K/uL   RBC 2.74 (L) 4.40 - 5.90 MIL/uL   Hemoglobin 8.8 (L) 13.0 - 18.0 g/dL   HCT 09.6 (L) 04.5 - 40.9 %   MCV 91.7 80.0 - 100.0 fL   MCH 32.0 26.0 - 34.0 pg   MCHC 34.9 32.0 - 36.0 g/dL   RDW 81.1 (H) 91.4 - 78.2 %   Platelets 144 (L) 150 - 440 K/uL      Recent Labs  10/24/14 0948 10/24/14 2226 10/25/14 0625  WBC 5.4  --  5.0  HGB 4.9* 7.3* 8.8*  HCT 14.5*  --  25.1*  PLT 164  --  144*   BMET  Recent Labs  10/24/14 0948 10/25/14 0625  NA 138 141  K 3.9 3.7  CL 99* 104  CO2 28 29  GLUCOSE 127* 97  BUN 95* 88*  CREATININE 2.05* 1.97*  CALCIUM 8.0* 7.8*   LFT  Recent Labs  10/24/14 0948  PROT 5.6*  ALBUMIN 2.6*  AST 38  ALT 16*  ALKPHOS 52  BILITOT 0.6   PT/INR  Recent Labs  10/24/14 0948  LABPROT 21.3*  INR 1.83   Hepatitis Panel No results for input(s): HEPBSAG, HCVAB, HEPAIGM,  HEPBIGM in the last 72 hours. C-Diff No results for input(s): CDIFFTOX in the last 72 hours. No results for input(s): CDIFFPCR in the last 72 hours.   Studies/Results: Dg Chest Portable 1 View  10/24/2014   CLINICAL DATA:  Weakness, fatigue and bilateral leg swelling for 1 week. Shortness of breath. History of atrial fibrillation and stroke.  EXAM: PORTABLE CHEST - 1 VIEW  COMPARISON:  08/26/2014 and 11/06/2013.  FINDINGS: 1022 hr. Cardiomegaly and mild vascular congestion appear unchanged. There is no overt pulmonary edema, confluent airspace opacity or significant pleural effusion. The bones appear unchanged. Telemetry leads overlie the chest.  IMPRESSION: Stable chronic cardiomegaly and mild vascular congestion. No acute findings demonstrated.   Electronically Signed   By: Carey Bullocks M.D.   On: 10/24/2014 10:55    Scheduled Inpatient Medications:   . digoxin  0.125 mg Oral QODAY  . sodium chloride  3 mL Intravenous Q12H    Continuous Inpatient Infusions:   . pantoprozole  (PROTONIX) infusion 8 mg/hr (10/25/14 1500)    PRN Inpatient Medications:  acetaminophen **OR** acetaminophen, albuterol, guaiFENesin-dextromethorphan, HYDROcodone-acetaminophen, nitroGLYCERIN, ondansetron **OR** ondansetron (ZOFRAN) IV  Miscellaneous:   Assessment:  1. melena. Not recurrent since admission. Hemodynamically stable. Currently on IV PPI. Awaiting results of Helicobacter pylori serology.  Plan:  #1. As above. Will advance diet to full liquids in morning.  Christena Deem MD 10/25/2014, 5:58 PM

## 2014-10-26 LAB — TYPE AND SCREEN
ABO/RH(D): A POS
ANTIBODY SCREEN: NEGATIVE
UNIT DIVISION: 0
UNIT DIVISION: 0
Unit division: 0
Unit division: 0

## 2014-10-26 LAB — CBC
HCT: 24.9 % — ABNORMAL LOW (ref 40.0–52.0)
Hemoglobin: 8.6 g/dL — ABNORMAL LOW (ref 13.0–18.0)
MCH: 31.8 pg (ref 26.0–34.0)
MCHC: 34.6 g/dL (ref 32.0–36.0)
MCV: 91.9 fL (ref 80.0–100.0)
PLATELETS: 145 10*3/uL — AB (ref 150–440)
RBC: 2.71 MIL/uL — AB (ref 4.40–5.90)
RDW: 15.5 % — AB (ref 11.5–14.5)
WBC: 10.8 10*3/uL — AB (ref 3.8–10.6)

## 2014-10-26 LAB — H PYLORI, IGM, IGG, IGA AB
H PYLORI IGG: 1.1 U/mL — AB (ref 0.0–0.8)
H. Pylogi, Iga Abs: <9 units (ref 0.0–8.9)
H. Pylogi, Igm Abs: <9 units (ref 0.0–8.9)

## 2014-10-26 MED ORDER — ALUM & MAG HYDROXIDE-SIMETH 200-200-20 MG/5ML PO SUSP
30.0000 mL | Freq: Two times a day (BID) | ORAL | Status: DC | PRN
  Administered 2014-10-26 – 2014-10-27 (×3): 30 mL via ORAL
  Filled 2014-10-26 (×4): qty 30

## 2014-10-26 NOTE — Progress Notes (Signed)
Visit made, patient sleeping, did not awaken. Writer spoke with staff RN Archie Patten who reports patient worked with PT today, needed the assist of 1 for transfer, his diet has been advanced from full liquid to soft, no further bloody stools/bowel movements since admission. Hemoglobin this morning was 8.6. Patient's son not present at time of visit, per Archie Patten he has been in to visit today. No discharge plans at this time, plan remains for patient to return to [facility] with hospice services when ready. Will continue to follow. Dayna Barker RN, BSN, [facility] [place], [facility] [phone] c

## 2014-10-26 NOTE — Progress Notes (Addendum)
[facility] - [place] at [facility]   PATIENT NAME: Terry Huerta    MR#:  [id number]  DATE OF BIRTH:  08/13/1919  SUBJECTIVE 81 year old male patient admitted to ICU secondary to upper GI bleed.  Having melena for about 1 week. Patient found to have severely anemic with hemoglobin of 4. Received 4 units of blood transfusion.   Hemoglobin stable at 8.6. No further management. Patient moved out of ICU. Patient denies any other complaints. Started on liquid diet. Tolerating the diet. H pylori test is pending .Marland Kitchen  CHIEF COMPLAINT:   Chief Complaint  Patient presents with  . Shortness of Breath  . Fatigue  . Leg Swelling  . Melena    REVIEW OF SYSTEMS:    Review of Systems  Constitutional: Negative for fever and chills.  HENT: Negative for hearing loss.   Eyes: Negative for blurred vision.  Respiratory: Negative for cough.   Cardiovascular: Negative for chest pain and palpitations.  Gastrointestinal: Negative for heartburn.  Genitourinary: Negative for dysuria.  Musculoskeletal: Negative for myalgias.  Neurological: Negative for dizziness and headaches.  Endo/Heme/Allergies: Does not bruise/bleed easily.  Psychiatric/Behavioral: Negative for depression.    Nutrition: diet  Tolerating Diet: Tolerating PT:      DRUG ALLERGIES:  No Known Allergies  VITALS:  Blood pressure 105/81, pulse 92, temperature 98.2 F (36.8 C), temperature source Oral, resp. rate 18, height  (1.702 m), weight 96.435 kg (212 lb 9.6 oz), SpO2 99 %.  PHYSICAL EXAMINATION:   Physical Exam  GENERAL:  81 y.o.-year-old patient lying in the bed with no acute distress.  Very hard of hearing. EYES: Pupils equal, round, reactive to light and accommodation. No scleral icterus. Extraocular muscles intact.  HEENT: Head atraumatic, normocephalic. Oropharynx and nasopharynx clear.  NECK:  Supple, no jugular venous distention. No thyroid enlargement, no tenderness.  LUNGS: Normal  breath sounds bilaterally, no wheezing, rales,rhonchi or crepitation. No use of accessory muscles of respiration.  CARDIOVASCULAR: S1, S2 normal. No murmurs, rubs, or gallops.  ABDOMEN: Soft, nontender, nondistended. Bowel sounds present. No organomegaly or mass.  EXTREMITIES: No pedal edema, cyanosis, or clubbing.  NEUROLOGIC: Cranial nerves II through XII are intact. Muscle strength 5/5 in all extremities. Sensation intact. Gait not checked.  PSYCHIATRIC: The patient is alert and oriented x 3.  SKIN: No obvious rash, lesion, or ulcer.    LABORATORY PANEL:   CBC  Recent Labs Lab 10/26/14 0412  WBC 10.8*  HGB 8.6*  HCT 24.9*  PLT 145*   ------------------------------------------------------------------------------------------------------------------  Chemistries   Recent Labs Lab 10/24/14 0948 10/25/14 0625  NA 138 141  K 3.9 3.7  CL 99* 104  CO2 28 29  GLUCOSE 127* 97  BUN 95* 88*  CREATININE 2.05* 1.97*  CALCIUM 8.0* 7.8*  AST 38  --   ALT 16*  --   ALKPHOS 52  --   BILITOT 0.6  --    ------------------------------------------------------------------------------------------------------------------  Cardiac Enzymes  Recent Labs Lab 10/24/14 1645  TROPONINI 0.09*   ------------------------------------------------------------------------------------------------------------------  RADIOLOGY:  Dg Chest Portable 1 View  10/24/2014   CLINICAL DATA:  Weakness, fatigue and bilateral leg swelling for 1 week. Shortness of breath. History of atrial fibrillation and stroke.  EXAM: PORTABLE CHEST - 1 VIEW  COMPARISON:  08/26/2014 and 11/06/2013.  FINDINGS: 1022 hr. Cardiomegaly and mild vascular congestion appear unchanged. There is no overt pulmonary edema, confluent airspace opacity or significant pleural effusion. The bones appear unchanged. Telemetry leads overlie the chest.  IMPRESSION:  Stable chronic cardiomegaly and mild vascular congestion. No acute findings  demonstrated.   Electronically Signed   By: Carey Bullocks M.D.   On: 10/24/2014 10:55     ASSESSMENT AND PLAN:   1. Upper GI bleed with acute blood loss anemia and hypotension likely precipitated by her Eliquis. Hold eliquis., continue Protonix drip for 72 hours. Watch for further evidence of bleeding,  check CBC every 12 hours. No endoscopy is planned as per discussion with patient's son. Start him on clear liquids today.d/w GI check H pylori. 2, hypotension secondary to blood loss anemia resolved. Acute renal failure with CK distress 3 due to ATN from hypotension resolving Chronic atrial fibrillation patient Eliquis is  stopped  Due to GI bleed.consult cardiology regarding anticoagulation due to ongoing GI bleed.hold coreg due to borderline blood pressure. continue digoxin. History of CVA ;no deficits  CODE STATUS DO NOT RESUSCITATE   All the records are reviewed and case discussed with Care Management/Social Workerr. Management plans discussed with the patient, family and they are in agreement.  CODE STATUS:full  TOTAL TIME TAKING CARE OF THIS PATIENT: .Critical care time  POSSIBLE D/C IN 3-4  DAYS, DEPENDING ON CLINICAL CONDITION.   Katha Hamming M.D on 10/26/2014 at 10:10 AM  Between 7am to 6pm - Pager - [phone]  After 6pm go to [email or web address] - password [email or web address] [facility]  [place] [facility]  Office  [phone]  CC: Primary care physician; Rolm Gala, MD

## 2014-10-26 NOTE — Care Management (Signed)
Patient presents from [facility].  He is currently followed by [facility].  Presented with weakness and found to have acute blood loss anemia.  GI involved.  Has received 4 units of packed cells.  Anticipate return to ALF with hospice when medially stable

## 2014-10-26 NOTE — Evaluation (Signed)
Physical Therapy Evaluation Patient Details Name: Terry Huerta MRN: [id number] DOB: 12/13/1919 Today's Date: 10/26/2014   History of Present Illness  presented to ER from ALF secondary to progressive fatigue, tarry stools; admitted with acute blood loss anemia related to upper GIB.  Initial HgB upon admission 4.9 s/p for units PRBC transfusion; currently 8.6.  Patient also noted with mild elevation in troponin consistent with demand ischemia per notes.  Clinical Impression  Upon evaluation, patient alert and oriented to all information; pleasant and cooperative.  [facility].  Strength and ROM grossly WFL throughout all extremities; generally edematous throughout abdomen and extremities (3+ pitting).  Globally weak and deconditioned, requiring physical assist for all functional activities.  Currently requiring mod assist for bed mobility; min/mod assist for sit/stand with RW; min assist for short-distance gait (bed/chair) with RW.  Very broad BOS with forward flexed posture, heavy WBing bilat UEs.  Maintains sats >92% on 2L at rest and with activity; however, additional activity deferred secondary to patient fatigue. Would benefit from skilled PT to address above deficits and promote optimal return to PLOF; Recommend transition to HHPT upon discharge from acute hospitalization..    Follow Up Recommendations Home health PT    Equipment Recommendations       Recommendations for Other Services       Precautions / Restrictions Precautions Precautions: Fall Restrictions Weight Bearing Restrictions: No      Mobility  Bed Mobility Overal bed mobility: Needs Assistance Bed Mobility: Supine to Sit     Supine to sit: Mod assist        Transfers Overall transfer level: Needs assistance Equipment used: Rolling walker (2 wheeled) Transfers: Sit to/from Stand Sit to Stand: Mod assist;Min assist            Ambulation/Gait Ambulation/Gait assistance: Min assist Ambulation  Distance (Feet): 5 Feet Assistive device: Rolling walker (2 wheeled)       General Gait Details: 3-point, step to gait pattern; broad BOS with mod forward trunk flexion.  Unable to tolerate additional distance due to fatigue. Sats remain >92% at rest and with exertion on 2.5L supplemental O2.  Stairs            Wheelchair Mobility    Modified Rankin (Stroke Patients Only)       Balance Overall balance assessment: Needs assistance Sitting-balance support: No upper extremity supported;Feet supported Sitting balance-Leahy Scale: Good     Standing balance support: Bilateral upper extremity supported Standing balance-Leahy Scale: Fair                               Pertinent Vitals/Pain Pain Assessment: No/denies pain    Home Living Family/patient expects to be discharged to:: Assisted living [facility])               Home Equipment: Dan Humphreys - 4 wheels      Prior Function Level of Independence: Needs assistance   Gait / Transfers Assistance Needed: Mod indep for bed mobility, basic transfers and ambulation to/from dining hall with [phone]  ADL's / Homemaking Assistance Needed: Assist from staff for ADLs as needed  Comments: Reports 1-2 falls in previous six months, "black outs when I stand up too quickly"     Hand Dominance        Extremity/Trunk Assessment   Upper Extremity Assessment: Overall WFL for tasks assessed           Lower Extremity Assessment: Overall WFL for  tasks assessed         Communication   Communication: No difficulties  Cognition Arousal/Alertness: Awake/alert Behavior During Therapy: WFL for tasks assessed/performed Overall Cognitive Status: Within Functional Limits for tasks assessed (very HOH)                      General Comments      Exercises        Assessment/Plan    PT Assessment Patient needs continued PT services  PT Diagnosis Generalized weakness;Difficulty walking   PT Problem List  Decreased strength;Decreased balance;Decreased mobility;Decreased knowledge of use of DME;Cardiopulmonary status limiting activity  PT Treatment Interventions Gait training;Functional mobility training;Therapeutic activities;Therapeutic exercise;Balance training;Patient/family education   PT Goals (Current goals can be found in the Care Plan section) Acute Rehab PT Goals Patient Stated Goal: "to get up a little bit" PT Goal Formulation: With patient Time For Goal Achievement: 11/09/14 Potential to Achieve Goals: Good    Frequency Min 2X/week   Barriers to discharge        Co-evaluation               End of Session Equipment Utilized During Treatment: Gait belt Activity Tolerance: Patient tolerated treatment well Patient left: in chair;with call bell/phone within reach;with chair alarm set Nurse Communication: Mobility status         Time: 1410-1434 PT Time Calculation (min) (ACUTE ONLY): 24 min   Charges:   PT Evaluation $Initial PT Evaluation Tier I: 1 Procedure     PT G Codes:        Esaiah Wanless H. Manson Passey, PT, DPT 10/26/2014, 2:54 PM [phone]

## 2014-10-26 NOTE — Consult Note (Signed)
Subjective: Patient seen for GI bleeding, black stools. Patient has had no further black stools since admission. Indeed there is been no black bowel movement. He has been hemodynamically stable and blood count has been stable since his transfusions.  He denies any nausea or vomiting or abdominal pain.  Objective: Vital signs in last 24 hours: Temp:  [97.8 F (36.6 C)-98.6 F (37 C)] 98.6 F (37 C) (06/02 1114) Pulse Rate:  [71-92] 82 (06/02 1446) Resp:  [16-22] 22 (06/02 1114) BP: (105-132)/(60-95) 132/95 mmHg (06/02 1114) SpO2:  [94 %-100 %] 98 % (06/02 1446) Weight:  [96.435 kg (212 lb 9.6 oz)] 96.435 kg (212 lb 9.6 oz) (06/02 0525) Blood pressure 132/95, pulse 82, temperature 98.6 F (37 C), temperature source Oral, resp. rate 22, height  (1.702 m), weight 96.435 kg (212 lb 9.6 oz), SpO2 98 %.   Intake/Output from previous day: 06/01 0701 - 06/02 0700 In: 203 [I.V.:203] Out: 1675 [Urine:1675]  Intake/Output this shift: Total I/O In: 459.8 [P.O.:240; I.V.:219.8] Out: 675 [Urine:675]   General appearance:  Well-appearing 81 year old Caucasian male no acute distress Resp:  Clear to auscultation Cardio: Irregular rate and rhythm GI:  Soft nontender nondistended bowel sounds positive normoactive Extremities:  2-3+ bilateral lower extremity edema extending upward also involving his upper extremities.   Lab Results: Results for orders placed or performed during the hospital encounter of 10/24/14 (from the past 24 hour(s))  CBC     Status: Abnormal   Collection Time: 10/26/14  4:12 AM  Result Value Ref Range   WBC 10.8 (H) 3.8 - 10.6 K/uL   RBC 2.71 (L) 4.40 - 5.90 MIL/uL   Hemoglobin 8.6 (L) 13.0 - 18.0 g/dL   HCT 95.2 (L) 84.1 - 32.4 %   MCV 91.9 80.0 - 100.0 fL   MCH 31.8 26.0 - 34.0 pg   MCHC 34.6 32.0 - 36.0 g/dL   RDW 40.1 (H) 02.7 - 25.3 %   Platelets 145 (L) 150 - 440 K/uL      Recent Labs  10/24/14 0948 10/24/14 2226 10/25/14 0625 10/26/14 0412   WBC 5.4  --  5.0 10.8*  HGB 4.9* 7.3* 8.8* 8.6*  HCT 14.5*  --  25.1* 24.9*  PLT 164  --  144* 145*   BMET  Recent Labs  10/24/14 0948 10/25/14 0625  NA 138 141  K 3.9 3.7  CL 99* 104  CO2 28 29  GLUCOSE 127* 97  BUN 95* 88*  CREATININE 2.05* 1.97*  CALCIUM 8.0* 7.8*   LFT  Recent Labs  10/24/14 0948  PROT 5.6*  ALBUMIN 2.6*  AST 38  ALT 16*  ALKPHOS 52  BILITOT 0.6   PT/INR  Recent Labs  10/24/14 0948  LABPROT 21.3*  INR 1.83   Hepatitis Panel No results for input(s): HEPBSAG, HCVAB, HEPAIGM, HEPBIGM in the last 72 hours. C-Diff No results for input(s): CDIFFTOX in the last 72 hours. No results for input(s): CDIFFPCR in the last 72 hours.   Studies/Results: No results found.  Scheduled Inpatient Medications:   . digoxin  0.125 mg Oral QODAY  . sodium chloride  3 mL Intravenous Q12H    Continuous Inpatient Infusions:   . pantoprozole (PROTONIX) infusion 8 mg/hr (10/26/14 0843)    PRN Inpatient Medications:  acetaminophen **OR** acetaminophen, albuterol, alum & mag hydroxide-simeth, guaiFENesin-dextromethorphan, HYDROcodone-acetaminophen, nitroGLYCERIN, ondansetron **OR** ondansetron (ZOFRAN) IV  Miscellaneous:   Assessment:  1. Black stools likely upper GI bleed. Labs stable posttransfusion and hemodynamically stable. Currently on  IV PPI. No sedated procedure per family and patient wishes.  Plan:  1. Awaiting results of Helicobacter pylori antibody study. 2. The new PPI with change to by mouth tomorrow.  Christena Deem MD 10/26/2014, 5:28 PM

## 2014-10-26 NOTE — Consult Note (Signed)
[facility] Cardiology Consultation Note  Patient ID: Terry Huerta, MRN: [id number], DOB/AGE: 07/17/1919 81 y.o. Admit date: 10/24/2014   Date of Consult: 10/26/2014 Primary Physician: Rolm Gala, MD Primary Cardiologist: Gwen Pounds  Chief Complaint:  Chief Complaint  Patient presents with  . Shortness of Breath  . Fatigue  . Leg Swelling  . Melena   Reason for Consult: nonvalvular atrial fibrillation with congestive heart failure chronic kidney disease stage III and anemia  HPI: 81 y.o. male with known chronic nonvalvular atrial fibrillation on appropriate medication management for heart rate control including digoxin with a reasonable heart rate at this time having an EKG showing atrial fibrillation with nonspecific ST and T-wave changes and heart rate of 68 bpm. The patient has been on appropriate anticoagulation for atrial fibrillation with chronic kidney disease stage III and has had some bleeding complications with significant anemia and a hemoglobin of 7.3. The patient does have black stools most consistent with upper GI bleed. The patient does have a previous stroke of which she is also has some dementia for this is reason why he is had the medication management listed above. The patient does have apparent chronic systolic dysfunction congestive heart failure with chronic shortness of breath pulmonary edema and lower extremity edema on diarrhetic as well for which she has not been significantly exacerbated. The patient may have 8 for further evaluation adjustments of medications. Recently he has had an elevated troponin of 0.09 most consistent with demand ischemia and no current evidence of myocardial infarction. The patient currently is stable and feeling much better at this time with a slightly elevated and improved hemoglobin  Past Medical History  Diagnosis Date  . Hypertension   . Hyperlipidemia   . Stroke   . Myocardial infarction   . Atrial fibrillation   .  Congestive heart failure   . GERD (gastroesophageal reflux disease)   . Chronic anticoagulation   . Hypokalemia   . Dementia   . Hypoxia   . Dysrhythmia   . Shortness of breath dyspnea       Surgical History: History reviewed. No pertinent past surgical history.   Home Meds: Prior to Admission medications   Medication Sig Start Date End Date Taking? Authorizing Provider  apixaban (ELIQUIS) 2.5 MG TABS tablet Take 2.5 mg by mouth 2 (two) times daily.   Yes Historical Provider, MD  aspirin EC 81 MG tablet Take 81 mg by mouth daily.   Yes Historical Provider, MD  carvedilol (COREG) 3.125 MG tablet Take 1.5625 mg by mouth 2 (two) times daily with a meal.   Yes Historical Provider, MD  digoxin (LANOXIN) 0.125 MG tablet Take 0.125 mg by mouth every other day. Pt takes every other day (on even days).   Yes Historical Provider, MD  enalapril (VASOTEC) 2.5 MG tablet Take 2.5 mg by mouth daily.   Yes Historical Provider, MD  guaiFENesin-dextromethorphan (ROBITUSSIN DM) 100-10 MG/5ML syrup Take 5-10 mLs by mouth every 4 (four) hours as needed for cough.   Yes Historical Provider, MD  loratadine (CLARITIN) 10 MG tablet Take 10 mg by mouth daily.   Yes Historical Provider, MD  Multiple Vitamins-Iron (MULTIVITAMINS WITH IRON) TABS tablet Take 1 tablet by mouth daily.   Yes Historical Provider, MD  nitroGLYCERIN (NITROSTAT) 0.4 MG SL tablet Place 0.4 mg under the tongue every 5 (five) minutes as needed for chest pain.   Yes Historical Provider, MD  torsemide (DEMADEX) 20 MG tablet Take 20 mg by mouth 2 (two)  times daily.   Yes Historical Provider, MD    Inpatient Medications:  . digoxin  0.125 mg Oral QODAY  . sodium chloride  3 mL Intravenous Q12H   . pantoprozole (PROTONIX) infusion 8 mg/hr (10/26/14 0843)    Allergies: No Known Allergies  History   Social History  . Marital Status: Widowed    Spouse Name: N/A  . Number of Children: N/A  . Years of Education: N/A   Occupational History   . Not on file.   Social History Main Topics  . Smoking status: Never Smoker   . Smokeless tobacco: Not on file  . Alcohol Use: No  . Drug Use: No  . Sexual Activity: No   Other Topics Concern  . Not on file   Social History Narrative     History reviewed. No pertinent family history.   Review of Systems Positive for shortness of breath Negative for: General:  chills, fever, night sweats or weight changes.  Cardiovascular: PND orthopnea syncope dizziness  Dermatological skin lesions rashes Respiratory: Cough congestion Urologic: Frequent urination urination at night and hematuria Abdominal: negative for nausea, vomiting, diarrhea, bright red blood per rectum,  or hematemesis Neurologic: negative for visual changes, and/or hearing changes  All other systems reviewed and are otherwise negative except as noted above.  Labs:  Recent Labs  10/24/14 0948 10/24/14 1645  TROPONINI 0.09* 0.09*   Lab Results  Component Value Date   WBC 10.8* 10/26/2014   HGB 8.6* 10/26/2014   HCT 24.9* 10/26/2014   MCV 91.9 10/26/2014   PLT 145* 10/26/2014    Recent Labs Lab 10/24/14 0948 10/25/14 0625  NA 138 141  K 3.9 3.7  CL 99* 104  CO2 28 29  BUN 95* 88*  CREATININE 2.05* 1.97*  CALCIUM 8.0* 7.8*  PROT 5.6*  --   BILITOT 0.6  --   ALKPHOS 52  --   ALT 16*  --   AST 38  --   GLUCOSE 127* 97   No results found for: CHOL, HDL, LDLCALC, TRIG No results found for: DDIMER  Radiology/Studies:  Dg Chest Portable 1 View  10/24/2014   CLINICAL DATA:  Weakness, fatigue and bilateral leg swelling for 1 week. Shortness of breath. History of atrial fibrillation and stroke.  EXAM: PORTABLE CHEST - 1 VIEW  COMPARISON:  08/26/2014 and 11/06/2013.  FINDINGS: 1022 hr. Cardiomegaly and mild vascular congestion appear unchanged. There is no overt pulmonary edema, confluent airspace opacity or significant pleural effusion. The bones appear unchanged. Telemetry leads overlie the chest.   IMPRESSION: Stable chronic cardiomegaly and mild vascular congestion. No acute findings demonstrated.   Electronically Signed   By: Carey Bullocks M.D.   On: 10/24/2014 10:55    EKG: Atrial fibrillation with nonspecific ST and T-wave changes  Weights: Filed Weights   10/24/14 0942 10/25/14 0527 10/26/14 0525  Weight: 215 lb 9.8 oz (97.8 kg) 213 lb 3 oz (96.7 kg) 212 lb 9.6 oz (96.435 kg)     Physical Exam: Blood pressure 132/95, pulse 82, temperature 98.6 F (37 C), temperature source Oral, resp. rate 22, height 5\' 7"  (1.702 m), weight 212 lb 9.6 oz (96.435 kg), SpO2 98 %. Body mass index is 33.29 kg/(m^2). General: Well developed, well nourished, in no acute distress. Head eyes ears nose throat: Normocephalic, atraumatic, sclera non-icteric, no xanthomas, nares are without discharge. No apparent thyromegaly and/or mass  Lungs: Normal respiratory effort.  no wheezes, + rales, no rhonchi.  Heart: Irregular RR with  normal S1 S2. no murmur gallop, no rub, PMI is diffuse and placement, carotid upstroke normal without bruit, jugular venous pressure is normal Abdomen: Soft, non-tender, non-distended with normoactive bowel sounds. No hepatomegaly. No rebound/guarding. No obvious abdominal masses. Abdominal aorta is normal size without bruit Extremities: 2+ edema. no cyanosis, no clubbing, positive ulcers  Peripheral : 2+ bilateral upper extremity pulses, 1 + bilateral femoral pulses, 0 + bilateral dorsal pedal pulse Neuro: Alert and oriented. No facial asymmetry. No focal deficit. Moves all extremities spontaneously. Musculoskeletal: Normal muscle tone with kyphosis Psych:  Responds to questions appropriately with a normal affect.    Assessment: 81 year old male with known chronic nonvalvular atrial fibrillation with chronic systolic dysfunction congestive heart failure mixed hyperlipidemia on a kidney disease stage III with previous stroke and now having new's onset anemia exacerbating above  with an elevated troponin consistent with demand ischemia rather than acute myocardial infarction  Plan: 1. Continue serial ECG and enzymes to assess for possible myocardial infarction 2. Continue heart rate control of atrial fibrillation with digoxin although would be concerned of chronic kidney disease stage III causing and/or causing issues with atrial fibrillation heart rate 3. Abstain from anticoagulation due to anemia and GI bleed 4. Lasix as needed for lower extremity edema pulmonary edema for cart failure 5. Echocardiogram for LV systolic dysfunction valvular heart disease with adjustments of medications listed above 6. Ambulation and following for any further significant symptoms requiring further adjustments of medications listed above  Signed, Lamar Blinks M.D. [facility] 10/26/2014, 5:47 PM

## 2014-10-27 ENCOUNTER — Inpatient Hospital Stay

## 2014-10-27 LAB — BASIC METABOLIC PANEL
Anion gap: 8 (ref 5–15)
BUN: 64 mg/dL — AB (ref 6–20)
CO2: 30 mmol/L (ref 22–32)
Calcium: 7.8 mg/dL — ABNORMAL LOW (ref 8.9–10.3)
Chloride: 103 mmol/L (ref 101–111)
Creatinine, Ser: 2.06 mg/dL — ABNORMAL HIGH (ref 0.61–1.24)
GFR calc non Af Amer: 26 mL/min — ABNORMAL LOW (ref >60)
GFR, EST AFRICAN AMERICAN: 31 mL/min — AB (ref >60)
Glucose, Bld: 99 mg/dL (ref 65–99)
POTASSIUM: 3.4 mmol/L — AB (ref 3.5–5.1)
Sodium: 141 mmol/L (ref 135–145)

## 2014-10-27 LAB — DIGOXIN LEVEL: Digoxin Level: 0.5 ng/mL — ABNORMAL LOW (ref 0.8–2.0)

## 2014-10-27 MED ORDER — TORSEMIDE 20 MG PO TABS
20.0000 mg | ORAL_TABLET | Freq: Two times a day (BID) | ORAL | Status: DC
Start: 2014-10-27 — End: 2014-10-31
  Administered 2014-10-27 – 2014-10-31 (×8): 20 mg via ORAL
  Filled 2014-10-27 (×8): qty 1

## 2014-10-27 MED ORDER — FUROSEMIDE 10 MG/ML IJ SOLN
40.0000 mg | Freq: Once | INTRAMUSCULAR | Status: AC
  Administered 2014-10-27: 40 mg via INTRAVENOUS
  Filled 2014-10-27: qty 4

## 2014-10-27 MED ORDER — PANTOPRAZOLE SODIUM 40 MG PO TBEC
40.0000 mg | DELAYED_RELEASE_TABLET | Freq: Two times a day (BID) | ORAL | Status: DC
  Administered 2014-10-27 – 2014-10-31 (×9): 40 mg via ORAL
  Filled 2014-10-27 (×9): qty 1

## 2014-10-27 MED ORDER — PANTOPRAZOLE SODIUM 40 MG PO TBEC: 40.0000 mg | DELAYED_RELEASE_TABLET | Freq: Every day | ORAL | Status: DC

## 2014-10-27 NOTE — Consult Note (Signed)
Subjective: Patient seen for GI bleeding. Patient presented with black stools several days ago. He has been hemodynamically stable on PPI without evidence of recurrent bleeding. He denies any nausea or abdominal pain. He is tolerating a regular diet.  He was due to be discharged today however an episode of severe leg pain. Evaluation for that has been negative in regard to DVT. The leg pain seems to be much improved. .  Objective: Vital signs in last 24 hours: Temp:  [98 F (36.7 C)-99.4 F (37.4 C)] 99.4 F (37.4 C) (06/03 1946) Pulse Rate:  [73-94] 84 (06/03 1946) Resp:  [18-20] 19 (06/03 1946) BP: (102-113)/(44-69) 102/44 mmHg (06/03 1946) SpO2:  [93 %-100 %] 100 % (06/03 1946) Weight:  [96.208 kg (212 lb 1.6 oz)] 96.208 kg (212 lb 1.6 oz) (06/03 0457) Blood pressure 102/44, pulse 84, temperature 99.4 F (37.4 C), temperature source Oral, resp. rate 19, height 5\' 7"  (1.702 m), weight 96.208 kg (212 lb 1.6 oz), SpO2 100 %.   Intake/Output from previous day: 06/02 0701 - 06/03 0700 In: 699.8 [P.O.:480; I.V.:219.8] Out: 1175 [Urine:1175]  Intake/Output this shift:     General appearance:  81 year old Caucasian male no acute distress Resp:  Clear to auscultation Cardio:  Irregular rate and rhythm GI:  Soft nontender nondistended bowel sounds positive normoactive Extremities:  3+ lower extremity edema 2+ upper extremity edema edema extending up the back to flanks.   Lab Results: Results for orders placed or performed during the hospital encounter of 10/24/14 (from the past 24 hour(s))  Basic metabolic panel     Status: Abnormal   Collection Time: 10/27/14  5:32 AM  Result Value Ref Range   Sodium 141 135 - 145 mmol/L   Potassium 3.4 (L) 3.5 - 5.1 mmol/L   Chloride 103 101 - 111 mmol/L   CO2 30 22 - 32 mmol/L   Glucose, Bld 99 65 - 99 mg/dL   BUN 64 (H) 6 - 20 mg/dL   Creatinine, Ser 2.63 (H) 0.61 - 1.24 mg/dL   Calcium 7.8 (L) 8.9 - 10.3 mg/dL   GFR calc non Af Amer 26  (L) >60 mL/min   GFR calc Af Amer 31 (L) >60 mL/min   Anion gap 8 5 - 15  Digoxin level     Status: Abnormal   Collection Time: 10/27/14  5:32 AM  Result Value Ref Range   Digoxin Level 0.5 (L) 0.8 - 2.0 ng/mL      Recent Labs  10/24/14 2226 10/25/14 0625 10/26/14 0412  WBC  --  5.0 10.8*  HGB 7.3* 8.8* 8.6*  HCT  --  25.1* 24.9*  PLT  --  144* 145*   BMET  Recent Labs  10/25/14 0625 10/27/14 0532  NA 141 141  K 3.7 3.4*  CL 104 103  CO2 29 30  GLUCOSE 97 99  BUN 88* 64*  CREATININE 1.97* 2.06*  CALCIUM 7.8* 7.8*   LFT No results for input(s): PROT, ALBUMIN, AST, ALT, ALKPHOS, BILITOT, BILIDIR, IBILI in the last 72 hours. PT/INR No results for input(s): LABPROT, INR in the last 72 hours. Hepatitis Panel No results for input(s): HEPBSAG, HCVAB, HEPAIGM, HEPBIGM in the last 72 hours. C-Diff No results for input(s): CDIFFTOX in the last 72 hours. No results for input(s): CDIFFPCR in the last 72 hours.   Studies/Results: US Venous Img Lower Unilateral Left  10/27/2014   CLINICAL DATA:  Left lower extremity swelling and pain for 1 week.  EXAM: LEFT LOWER EXTREMITY  VENOUS DOPPLER ULTRASOUND  TECHNIQUE: Gray-scale sonography with graded compression, as well as color Doppler and duplex ultrasound were performed to evaluate the lower extremity deep venous systems from the level of the common femoral vein and including the common femoral, femoral, profunda femoral, popliteal and calf veins including the posterior tibial, peroneal and gastrocnemius veins when visible. The superficial great saphenous vein was also interrogated. Spectral Doppler was utilized to evaluate flow at rest and with distal augmentation maneuvers in the common femoral, femoral and popliteal veins.  COMPARISON:  None.  FINDINGS: Contralateral Common Femoral Vein: Respiratory phasicity is normal and symmetric with the symptomatic side. No evidence of thrombus. Normal compressibility.  Common Femoral Vein: No  evidence of thrombus. Normal compressibility, respiratory phasicity and response to augmentation.  Saphenofemoral Junction: No evidence of thrombus. Normal compressibility and flow on color Doppler imaging.  Profunda Femoral Vein: No evidence of thrombus. Normal compressibility and flow on color Doppler imaging.  Femoral Vein: No evidence of thrombus. Normal compressibility, respiratory phasicity and response to augmentation.  Popliteal Vein: No evidence of thrombus. Normal compressibility, respiratory phasicity and response to augmentation.  Calf Veins: No evidence of thrombus. Normal compressibility and flow on color Doppler imaging.  Superficial Great Saphenous Vein: Proximal left GSV is dilated to nearly 2 cm without associated superficial thrombosis. Appearance suggests developing GSV venous insufficiency.  Venous Reflux:  None.  Other Findings:  None.  IMPRESSION: Negative for left lower extremity DVT.   Electronically Signed   By: Judie Petit.  Shick M.D.   On: 10/27/2014 17:35    Scheduled Inpatient Medications:   . digoxin  0.125 mg Oral QODAY  . pantoprazole  40 mg Oral BID  . sodium chloride  3 mL Intravenous Q12H  . torsemide  20 mg Oral BID    Continuous Inpatient Infusions:     PRN Inpatient Medications:  acetaminophen **OR** acetaminophen, albuterol, alum & mag hydroxide-simeth, guaiFENesin-dextromethorphan, HYDROcodone-acetaminophen, nitroGLYCERIN, ondansetron **OR** ondansetron (ZOFRAN) IV  Miscellaneous:   Assessment:  1. GI bleed likely upper/gastric in nature. Family opted decision not to pursue sedated procedure. Helicobacter pylori IgG serology has however return positive.  Plan:  1. Continue twice a day PPI orally 2. Recommend Helicobacter pylori eradication protocol as follows, amoxicillin 1 g by mouth twice a day for 14 days, clarithromycin 500 mg by mouth twice a day for 14 days. Continue protonix 40 mg twice a day through the course of treatment with anti-biotics then  continue once a day after protocols completed.  Christena Deem MD 10/27/2014, 9:31 PM

## 2014-10-27 NOTE — Progress Notes (Signed)
Patient c/o sharpe pain to the left leg and thigh, DR Luberta Mutter informed and order US doppler of left leg order will continue to monitor

## 2014-10-27 NOTE — Progress Notes (Signed)
[facility] Cardiology [facility] Encounter Note  Patient: Terry Huerta / Admit Date: 10/24/2014 / Date of Encounter: 10/27/2014, 8:25 AM   Subjective: Shortness of breath is improving and no evidence of congestive heart failure or chest pain at this time  Review of Systems: Positive for: Shortness of breath Negative for: Vision change, hearing change, syncope, dizziness, nausea, vomiting,diarrhea, bloody stool, stomach pain, cough, congestion, diaphoresis, urinary frequency, urinary pain,skin lesions, skin rashes Others previously listed  Objective: Telemetry: Atrial fibrillation with controlled ventricular rate Physical Exam: Blood pressure 112/57, pulse 94, temperature 98.1 F (36.7 C), temperature source Oral, resp. rate 18, height  (1.702 m), weight 212 lb 1.6 oz (96.208 kg), SpO2 93 %. Body mass index is 33.21 kg/(m^2). General: Well developed, well nourished, in no acute distress. Head: Normocephalic, atraumatic, sclera non-icteric, no xanthomas, nares are without discharge. Neck: No apparent masses Lungs: Normal respirations with no wheezes, no rhonchi, no rales , basilar crackles   Heart: Irregular rate and rhythm, normal S1 S2, 2+ mitral murmur, no rub, no gallop, PMI is normal size and placement, carotid upstroke normal without bruit, jugular venous pressure normal Abdomen: Soft, non-tender, non-distended with normoactive bowel sounds. No hepatosplenomegaly. Abdominal aorta is normal size without bruit Extremities: 1+ edema, no clubbing, no cyanosis, no ulcers,  Peripheral: 2+ radial, 2+ femoral, 2+ dorsal pedal pulses Neuro: Alert and oriented. Moves all extremities spontaneously. Psych:  Responds to questions appropriately with a normal affect.   Intake/Output Summary (Last 24 hours) at 10/27/14 0825 Last data filed at 10/27/14 0457  Gross per 24 hour  Intake 699.75 ml  Output    925 ml  Net -225.25 ml    Inpatient Medications:  . digoxin  0.125 mg Oral  QODAY  . sodium chloride  3 mL Intravenous Q12H   Infusions:  . pantoprozole (PROTONIX) infusion 8 mg/hr (10/26/14 2022)    Labs:  Recent Labs  10/25/14 0625 10/27/14 0532  NA 141 141  K 3.7 3.4*  CL 104 103  CO2 29 30  GLUCOSE 97 99  BUN 88* 64*  CREATININE 1.97* 2.06*  CALCIUM 7.8* 7.8*    Recent Labs  10/24/14 0948  AST 38  ALT 16*  ALKPHOS 52  BILITOT 0.6  PROT 5.6*  ALBUMIN 2.6*    Recent Labs  10/25/14 0625 10/26/14 0412  WBC 5.0 10.8*  HGB 8.8* 8.6*  HCT 25.1* 24.9*  MCV 91.7 91.9  PLT 144* 145*    Recent Labs  10/24/14 0948 10/24/14 1645  TROPONINI 0.09* 0.09*   Invalid input(s): POCBNP No results for input(s): HGBA1C in the last 72 hours.   Weights: Filed Weights   10/25/14 0527 10/26/14 0525 10/27/14 0457  Weight: 213 lb 3 oz (96.7 kg) 212 lb 9.6 oz (96.435 kg) 212 lb 1.6 oz (96.208 kg)     Radiology/Studies:  Dg Chest Portable 1 View  10/24/2014   CLINICAL DATA:  Weakness, fatigue and bilateral leg swelling for 1 week. Shortness of breath. History of atrial fibrillation and stroke.  EXAM: PORTABLE CHEST - 1 VIEW  COMPARISON:  08/26/2014 and 11/06/2013.  FINDINGS: 1022 hr. Cardiomegaly and mild vascular congestion appear unchanged. There is no overt pulmonary edema, confluent airspace opacity or significant pleural effusion. The bones appear unchanged. Telemetry leads overlie the chest.  IMPRESSION: Stable chronic cardiomegaly and mild vascular congestion. No acute findings demonstrated.   Electronically Signed   By: Carey Bullocks M.D.   On: 10/24/2014 10:55     Assessment and  Recommendation  81 y.o. male with known chronic nonvalvular atrial fibrillation previous history of stroke mixed hyperlipidemia chronic kidney disease stage III with acute episode of bleeding and anemia exacerbating Rondic systolic dysfunction congestive heart failure with elevated troponin consistent with demand ischemia and no current evidence of acute coronary  syndrome 1. Abstain from anticoagulation due to significant anemia and probable bleeding knowing patient does have stroke risk with this line 2. No change in current medical regimen including digoxin for heart rate control of atrial fibrillation although there are concerns of chronic kidney disease and elevated levels of digoxin and would consider changing to metoprolol if bradycardia or worsening chronic kidney disease occur 3. Consider echocardiogram for LV systolic dysfunction valvular heart disease contributing to above 4. Continue gentle diuresis as before for further lower extremity edema pulmonary edema and chronic systolic dysfunction congestive heart failure 5. No further intervention of minimal elevation of troponin most consistent with ischemia rather than acute coronary syndrome 6. Ambulation and follow for any further significant symptoms and treatment of anemia which is the primary exacerbate her of heart failure and further adjustments of medications thereof as necessary  Signed, Arnoldo Hooker M.D. FACC

## 2014-10-27 NOTE — Care Management (Signed)
Had anticipated discharge back to assisted living with hospice today but patient developed severe leg pain.  US doppler has been ordered.  Clydie Braun with hospice is aware

## 2014-10-27 NOTE — Progress Notes (Signed)
Visit made. Patient seen lying in bed, in extreme pain,calling out and restless.Son [name], daughter in law Olegario Messier and former care giver Okey Dupre present at bedside. Staff RN Abigail in to administer pain medication. Per staff RN and family patient's pain came on suddenly, they report he had chills that started aprox 11:00 am and progressed to the pain he was experiencing during writers visit. Per Staff RN attending physician Dr. Luberta Mutter has ordered a doppler study to r/o DVT. Patient is currently not on any anticoagulants d/t his GI bleed. Emotional support given as [name] was very tearful and upset. Patient repositioned and was starting to relax some at end of visit. Will continue to follow and update hospice team. No planned discharge date at this time. Dayna Barker RN, BSN, [facility] [place] [facility] [phone] c *Writer checked back in with patient and family aprox 30 minutes alter and patient was sleeping soundly, pain medication was effective.

## 2014-10-27 NOTE — Progress Notes (Signed)
[facility] Physicians - [place] at [facility]   PATIENT NAME: Terry Huerta    MR#:  [id number]  DATE OF BIRTH:  10-07-1919  SUBJECTIVE 81 year old male patient admitted to ICU secondary to upper GI bleed.  Having melena for about 1 week. Patient found to have severely anemic with hemoglobin of 4. Received 4 units of blood transfusion.   Hemoglobin stable at 8.6. No further malena.. Patient moved out of ICU. Patient denies any other complaints. Seen today denies any complaints. Does have some lower anterior edema. Patient [facility] from cardiology he'll be off anticoagulation because of his  acute GI bleed.   CHIEF COMPLAINT:   Chief Complaint  Patient presents with  . Shortness of Breath  . Fatigue  . Leg Swelling  . Melena    REVIEW OF SYSTEMS:    Review of Systems  Constitutional: Negative for fever and chills.  HENT: Negative for hearing loss.   Eyes: Negative for blurred vision, double vision and photophobia.  Respiratory: Negative for cough, hemoptysis and shortness of breath.   Cardiovascular: Negative for palpitations, orthopnea and leg swelling.  Gastrointestinal: Negative for vomiting, abdominal pain and diarrhea.  Genitourinary: Negative for dysuria and urgency.  Musculoskeletal: Negative for myalgias and neck pain.  Skin: Negative for rash.  Neurological: Negative for dizziness, focal weakness, seizures, weakness and headaches.  Psychiatric/Behavioral: Negative for memory loss. The patient does not have insomnia.     Nutrition: diet  Tolerating Diet: Tolerating PT:      DRUG ALLERGIES:  No Known Allergies  VITALS:  Blood pressure 112/57, pulse 94, temperature 98.1 F (36.7 C), temperature source Oral, resp. rate 18, height 5\' 7"  (1.702 m), weight 96.208 kg (212 lb 1.6 oz), SpO2 93 %.  PHYSICAL EXAMINATION:   Physical Exam  GENERAL:  81 y.o.-year-old patient lying in the bed with no acute distress.  Very hard of hearing. EYES:  Pupils equal, round, reactive to light and accommodation. No scleral icterus. Extraocular muscles intact.  HEENT: Head atraumatic, normocephalic. Oropharynx and nasopharynx clear.  NECK:  Supple, no jugular venous distention. No thyroid enlargement, no tenderness.  LUNGS: Normal breath sounds bilaterally, no wheezing, rales,rhonchi or crepitation. No use of accessory muscles of respiration.  CARDIOVASCULAR: S1, S2 normal. No murmurs, rubs, or gallops.  ABDOMEN: Soft, nontender, nondistended. Bowel sounds present. No organomegaly or mass.  EXTREMITIES: No pedal edema, cyanosis, or clubbing.  NEUROLOGIC: Cranial nerves II through XII are intact. Muscle strength 5/5 in all extremities. Sensation intact. Gait not checked.  PSYCHIATRIC: The patient is alert and oriented x 3.  SKIN: No obvious rash, lesion, or ulcer.    LABORATORY PANEL:   CBC  Recent Labs Lab 10/26/14 0412  WBC 10.8*  HGB 8.6*  HCT 24.9*  PLT 145*   ------------------------------------------------------------------------------------------------------------------  Chemistries   Recent Labs Lab 10/24/14 0948  10/27/14 0532  NA 138  < > 141  K 3.9  < > 3.4*  CL 99*  < > 103  CO2 28  < > 30  GLUCOSE 127*  < > 99  BUN 95*  < > 64*  CREATININE 2.05*  < > 2.06*  CALCIUM 8.0*  < > 7.8*  AST 38  --   --   ALT 16*  --   --   ALKPHOS 52  --   --   BILITOT 0.6  --   --   < > = values in this interval not displayed. ------------------------------------------------------------------------------------------------------------------  Cardiac Enzymes  Recent  Labs Lab 10/24/14 1645  TROPONINI 0.09*   ------------------------------------------------------------------------------------------------------------------  RADIOLOGY:  No results found.   ASSESSMENT AND PLAN:   1. Upper GI bleed with acute blood loss anemia and hypotension likely precipitated by her Eliquis. It is post 4 units of transfusion hemoglobin  stable at this time received for 72 hours a Protonix drip we will change the Protonix to by mouth 40 mg twice a day  No endoscopy is planned as per discussion with patient's son. Start him on clear liquids today.d/w GI check H pylori. 2, hypotension secondary to blood loss anemia resolved. Acute renal failure with CK distress 3 due to ATN from hypotension resolving. Renal function is stable Chronic atrial fibrillation patient Eliquis is  stopped  Due to GI bleed.consulted cardiology regarding anticoagulation due to ongoing GI bleed.patient will be off anticoagulation.  Check echo as per cardiology. Continue digoxin but check dig levels.   Chronic systolic heart failure with low excellent edema;   Can give a small dose of Lasix at 40 mg secondary to leg swelling. Possible discharge tomorrow to home place with hospice. ll the records are reviewed and case discussed with Care Management/Social Workerr. Management plans discussed with the patient, family and they are in agreement.  CODE STATUS:full  TOTAL TIME TAKING CARE OF THIS PATIENT: .Critical care time  POSSIBLE D/C IN 3-4  DAYS, DEPENDING ON CLINICAL CONDITION.   Katha Hamming M.D on 10/27/2014 at 9:39 AM  Between 7am to 6pm - Pager - [phone]  After 6pm go to [email or web address] - password [email or web address] [facility]  [facility]  Office  [phone]  CC: Primary care physician; Rolm Gala, MD

## 2014-10-27 NOTE — Progress Notes (Signed)
PT Cancellation Note  Patient Details Name: Terry Huerta MRN: [id number] DOB: 04-23-1920   Cancelled Treatment:    Reason Eval/Treat Not Completed: Patient declined, no reason specified (Patient up in recliner, reports trouble with indigestion.  Noted to be spitting up brownish material at times.  RN informed/aware, requested antacid as available.  Will continue efforts as patient medically appropriate and able to tolerate.)   Kelin Borum H. Manson Passey, PT, DPT 10/27/2014, 4:27 PM [phone]

## 2014-10-28 ENCOUNTER — Inpatient Hospital Stay
Admit: 2014-10-28 | Discharge: 2014-10-28 | Disposition: A | Discharge status: Home / Self Care | Attending: Internal Medicine | Admitting: Internal Medicine

## 2014-10-28 LAB — BASIC METABOLIC PANEL
Anion gap: 7 (ref 5–15)
BUN: 57 mg/dL — AB (ref 6–20)
CALCIUM: 7.5 mg/dL — AB (ref 8.9–10.3)
CHLORIDE: 101 mmol/L (ref 101–111)
CO2: 28 mmol/L (ref 22–32)
Creatinine, Ser: 2.09 mg/dL — ABNORMAL HIGH (ref 0.61–1.24)
GFR calc Af Amer: 30 mL/min — ABNORMAL LOW (ref >60)
GFR, EST NON AFRICAN AMERICAN: 26 mL/min — AB (ref >60)
GLUCOSE: 100 mg/dL — AB (ref 65–99)
Potassium: 3.6 mmol/L (ref 3.5–5.1)
Sodium: 136 mmol/L (ref 135–145)

## 2014-10-28 NOTE — Progress Notes (Signed)
[facility] Physicians - [place] at [facility]   PATIENT NAME: Terry Huerta    MR#:  [id number]  DATE OF BIRTH:  December 23, 1919  SUBJECTIVE 81 year old male patient admitted to ICU secondary to upper GI bleed.  Having melena for about 1 week. Patient found to have severely anemic with hemoglobin of 4. Received 4 units of blood transfusion.   Hemoglobin stable at 8.6. No further malena.. Patient moved out of ICU. Patient denies any other complaints. Seen today denies any complaints. Does have some lower anterior edema. Patient seen by [facility] from cardiology he'll be off anticoagulation because of his  acute GI bleed. Seen today patient denies any complaints. Lower extremity edema present DVT study is negative.   CHIEF COMPLAINT:   Chief Complaint  Patient presents with  . Shortness of Breath  . Fatigue  . Leg Swelling  . Melena    REVIEW OF SYSTEMS:    Review of Systems  Constitutional: Negative for fever and chills.  HENT: Negative for hearing loss.   Eyes: Negative for blurred vision, double vision and photophobia.  Respiratory: Negative for cough, hemoptysis and shortness of breath.   Cardiovascular: Negative for palpitations, orthopnea and leg swelling.  Gastrointestinal: Negative for vomiting, abdominal pain and diarrhea.  Genitourinary: Negative for dysuria and urgency.  Musculoskeletal: Negative for myalgias and neck pain.  Skin: Negative for rash.  Neurological: Negative for dizziness, focal weakness, seizures, weakness and headaches.  Psychiatric/Behavioral: Negative for memory loss. The patient does not have insomnia.     Nutrition: diet  Tolerating Diet: Tolerating PT:      DRUG ALLERGIES:  No Known Allergies  VITALS:  Blood pressure 112/46, pulse 77, temperature 98.4 F (36.9 C), temperature source Oral, resp. rate 20, height  (1.702 m), weight 101.197 kg (223 lb 1.6 oz), SpO2 100 %.  PHYSICAL EXAMINATION:   Physical Exam  GENERAL:   81 y.o.-year-old patient lying in the bed with no acute distress.  Very hard of hearing. EYES: Pupils equal, round, reactive to light and accommodation. No scleral icterus. Extraocular muscles intact.  HEENT: Head atraumatic, normocephalic. Oropharynx and nasopharynx clear.  NECK:  Supple, no jugular venous distention. No thyroid enlargement, no tenderness.  LUNGS: Normal breath sounds bilaterally, no wheezing, rales,rhonchi or crepitation. No use of accessory muscles of respiration.  CARDIOVASCULAR: S1, S2 normal. No murmurs, rubs, or gallops.  ABDOMEN: Soft, nontender, nondistended. Bowel sounds present. No organomegaly or mass.  EXTREMITIES: No pedal edema, cyanosis, or clubbing.  NEUROLOGIC: Cranial nerves II through XII are intact. Muscle strength 5/5 in all extremities. Sensation intact. Gait not checked.  PSYCHIATRIC: The patient is alert and oriented x 3.  SKIN: No obvious rash, lesion, or ulcer.    LABORATORY PANEL:   CBC  Recent Labs Lab 10/26/14 0412  WBC 10.8*  HGB 8.6*  HCT 24.9*  PLT 145*   ------------------------------------------------------------------------------------------------------------------  Chemistries   Recent Labs Lab 10/24/14 0948  10/28/14 0510  NA 138  < > 136  K 3.9  < > 3.6  CL 99*  < > 101  CO2 28  < > 28  GLUCOSE 127*  < > 100*  BUN 95*  < > 57*  CREATININE 2.05*  < > 2.09*  CALCIUM 8.0*  < > 7.5*  AST 38  --   --   ALT 16*  --   --   ALKPHOS 52  --   --   BILITOT 0.6  --   --   < > =  values in this interval not displayed. ------------------------------------------------------------------------------------------------------------------  Cardiac Enzymes  Recent Labs Lab 10/24/14 1645  TROPONINI 0.09*   ------------------------------------------------------------------------------------------------------------------  RADIOLOGY:  US Venous Img Lower Unilateral Left  10/27/2014   CLINICAL DATA:  Left lower extremity swelling  and pain for 1 week.  EXAM: LEFT LOWER EXTREMITY VENOUS DOPPLER ULTRASOUND  TECHNIQUE: Gray-scale sonography with graded compression, as well as color Doppler and duplex ultrasound were performed to evaluate the lower extremity deep venous systems from the level of the common femoral vein and including the common femoral, femoral, profunda femoral, popliteal and calf veins including the posterior tibial, peroneal and gastrocnemius veins when visible. The superficial great saphenous vein was also interrogated. Spectral Doppler was utilized to evaluate flow at rest and with distal augmentation maneuvers in the common femoral, femoral and popliteal veins.  COMPARISON:  None.  FINDINGS: Contralateral Common Femoral Vein: Respiratory phasicity is normal and symmetric with the symptomatic side. No evidence of thrombus. Normal compressibility.  Common Femoral Vein: No evidence of thrombus. Normal compressibility, respiratory phasicity and response to augmentation.  Saphenofemoral Junction: No evidence of thrombus. Normal compressibility and flow on color Doppler imaging.  Profunda Femoral Vein: No evidence of thrombus. Normal compressibility and flow on color Doppler imaging.  Femoral Vein: No evidence of thrombus. Normal compressibility, respiratory phasicity and response to augmentation.  Popliteal Vein: No evidence of thrombus. Normal compressibility, respiratory phasicity and response to augmentation.  Calf Veins: No evidence of thrombus. Normal compressibility and flow on color Doppler imaging.  Superficial Great Saphenous Vein: Proximal left GSV is dilated to nearly 2 cm without associated superficial thrombosis. Appearance suggests developing GSV venous insufficiency.  Venous Reflux:  None.  Other Findings:  None.  IMPRESSION: Negative for left lower extremity DVT.   Electronically Signed   By: Judie Petit.  Shick M.D.   On: 10/27/2014 17:35     ASSESSMENT AND PLAN:   1. Upper GI bleed with acute blood loss anemia and  hypotension likely precipitated by her Eliquis. It is post 4 units of transfusion hemoglobin stable at this time received for 72 hours a Protonix drip we will change the Protonix to by mouth 40 mg twice a day  No endoscopy is planned as per discussion with patient's son. Continue regular diet, h pylori test is positive,, started on treatment. i. 2, hypotension secondary to blood loss anemia resolved. Human torsemide secondary to edema and CHF. Acute renal failure with CKD 3 due to ATN from hypotension resolving. Renal function is stable Chronic atrial fibrillation patient Eliquis is  stopped  Due to GI bleed.consulted cardiology regarding anticoagulation due to ongoing GI bleed.patient will be off anticoagulation.  Check echo as per cardiology. Continue digoxin but .   Chronic systolic heart failure with low excellent edema;   regime his torsemide.  Weakness continue physical therapy patient still needing a lot of Assistance  to go to bathroom ,son is very concerned about this so will continue physical therapy likely discharge either Sunday or Monday. ll the records are reviewed and case discussed with Care Management/Social Workerr. Management plans discussed with the patient, family and they are in agreement.  CODE STATUS:full  TOTAL TIME TAKING CARE OF THIS PATIENT: POSSIBLE D/C IN 3-4  DAYS, DEPENDING ON CLINICAL CONDITION.   Katha Hamming M.D on 10/28/2014 at 10:52 AM  Between 7am to 6pm - Pager - [phone]  After 6pm go to [email or web address] - password [email or web address] [facility]  [facility]  Office  [phone]  CC: Primary care physician;  Rolm Gala, MD

## 2014-10-28 NOTE — Progress Notes (Signed)
*  PRELIMINARY RESULTS* Echocardiogram 2D Echocardiogram has been performed.  Garrel Ridgel Stills 10/28/2014, 1:36 PM

## 2014-10-28 NOTE — Progress Notes (Signed)
[facility] Encounter Note  Patient: Terry Huerta / Admit Date: 10/24/2014 / Date of Encounter: 10/28/2014, 8:39 AM   Subjective: Shortness of breath is improving and no evidence of worsening congestive heart failure or chest pain at this time  Review of Systems: Positive for: Shortness of breath Negative for: Vision change, hearing change, syncope, dizziness, nausea, vomiting,diarrhea, bloody stool, stomach pain, cough, congestion, diaphoresis, urinary frequency, urinary pain,skin lesions, skin rashes Others previously listed  Objective: Telemetry: Atrial fibrillation with controlled ventricular rate Physical Exam: Blood pressure 112/46, pulse 77, temperature 98.4 F (36.9 C), temperature source Oral, resp. rate 20, height  (1.702 m), weight 223 lb 1.6 oz (101.197 kg), SpO2 100 %. Body mass index is 34.93 kg/(m^2). General: Well developed, well nourished, in no acute distress. Head: Normocephalic, atraumatic, sclera non-icteric, no xanthomas, nares are without discharge. Neck: No apparent masses Lungs: Normal respirations with no wheezes, no rhonchi, no rales , basilar crackles   Heart: Irregular rate and rhythm, normal S1 S2, 2+ mitral murmur, no rub, no gallop, PMI is normal size and placement, carotid upstroke normal without bruit, jugular venous pressure normal Abdomen: Soft, non-tender, non-distended with normoactive bowel sounds. No hepatosplenomegaly. Abdominal aorta is normal size without bruit Extremities: 1+ edema, no clubbing, no cyanosis, no ulcers,  Peripheral: 2+ radial, 2+ femoral, 2+ dorsal pedal pulses Neuro: Alert   Moves all extremities spontaneously. Psych:  Responds to questions appropriately with a normal affect.   Intake/Output Summary (Last 24 hours) at 10/28/14 0839 Last data filed at 10/28/14 0758  Gross per 24 hour  Intake    125 ml  Output    575 ml  Net   -450 ml    Inpatient Medications:  . digoxin  0.125 mg Oral  QODAY  . pantoprazole  40 mg Oral BID  . sodium chloride  3 mL Intravenous Q12H  . torsemide  20 mg Oral BID   Infusions:     Labs:  Recent Labs  10/27/14 0532 10/28/14 0510  NA 141 136  K 3.4* 3.6  CL 103 101  CO2 30 28  GLUCOSE 99 100*  BUN 64* 57*  CREATININE 2.06* 2.09*  CALCIUM 7.8* 7.5*   No results for input(s): AST, ALT, ALKPHOS, BILITOT, PROT, ALBUMIN in the last 72 hours.  Recent Labs  10/26/14 0412  WBC 10.8*  HGB 8.6*  HCT 24.9*  MCV 91.9  PLT 145*   No results for input(s): CKTOTAL, CKMB, TROPONINI in the last 72 hours. Invalid input(s): POCBNP No results for input(s): HGBA1C in the last 72 hours.   Weights: Filed Weights   10/26/14 0525 10/27/14 0457 10/28/14 0437  Weight: 212 lb 9.6 oz (96.435 kg) 212 lb 1.6 oz (96.208 kg) 223 lb 1.6 oz (101.197 kg)     Radiology/Studies:  US Venous Img Lower Unilateral Left  10/27/2014   CLINICAL DATA:  Left lower extremity swelling and pain for 1 week.  EXAM: LEFT LOWER EXTREMITY VENOUS DOPPLER ULTRASOUND  TECHNIQUE: Gray-scale sonography with graded compression, as well as color Doppler and duplex ultrasound were performed to evaluate the lower extremity deep venous systems from the level of the common femoral vein and including the common femoral, femoral, profunda femoral, popliteal and calf veins including the posterior tibial, peroneal and gastrocnemius veins when visible. The superficial great saphenous vein was also interrogated. Spectral Doppler was utilized to evaluate flow at rest and with distal augmentation maneuvers in the common femoral, femoral and popliteal veins.  COMPARISON:  None.  FINDINGS: Contralateral Common Femoral Vein: Respiratory phasicity is normal and symmetric with the symptomatic side. No evidence of thrombus. Normal compressibility.  Common Femoral Vein: No evidence of thrombus. Normal compressibility, respiratory phasicity and response to augmentation.  Saphenofemoral Junction: No  evidence of thrombus. Normal compressibility and flow on color Doppler imaging.  Profunda Femoral Vein: No evidence of thrombus. Normal compressibility and flow on color Doppler imaging.  Femoral Vein: No evidence of thrombus. Normal compressibility, respiratory phasicity and response to augmentation.  Popliteal Vein: No evidence of thrombus. Normal compressibility, respiratory phasicity and response to augmentation.  Calf Veins: No evidence of thrombus. Normal compressibility and flow on color Doppler imaging.  Superficial Great Saphenous Vein: Proximal left GSV is dilated to nearly 2 cm without associated superficial thrombosis. Appearance suggests developing GSV venous insufficiency.  Venous Reflux:  None.  Other Findings:  None.  IMPRESSION: Negative for left lower extremity DVT.   Electronically Signed   By: Judie Petit.  Shick M.D.   On: 10/27/2014 17:35   Dg Chest Portable 1 View  10/24/2014   CLINICAL DATA:  Weakness, fatigue and bilateral leg swelling for 1 week. Shortness of breath. History of atrial fibrillation and stroke.  EXAM: PORTABLE CHEST - 1 VIEW  COMPARISON:  08/26/2014 and 11/06/2013.  FINDINGS: 1022 hr. Cardiomegaly and mild vascular congestion appear unchanged. There is no overt pulmonary edema, confluent airspace opacity or significant pleural effusion. The bones appear unchanged. Telemetry leads overlie the chest.  IMPRESSION: Stable chronic cardiomegaly and mild vascular congestion. No acute findings demonstrated.   Electronically Signed   By: Carey Bullocks M.D.   On: 10/24/2014 10:55     Assessment and Recommendation  81 y.o. male with known chronic nonvalvular atrial fibrillation previous history of stroke mixed hyperlipidemia chronic kidney disease stage III with acute episode of bleeding and anemia exacerbating Rondic systolic dysfunction congestive heart failure with elevated troponin consistent with demand ischemia and no current evidence of acute coronary syndrome 1. Abstain from  anticoagulation due to significant anemia and probable bleeding knowing patient does have stroke risk with this line  2. No change in current medical regimen including digoxin for heart rate control of atrial fibrillation although there are concerns of chronic kidney disease and elevated levels of digoxin and would consider changing to metoprolol if bradycardia or worsening chronic kidney disease occur 3. Consider echocardiogram for LV systolic dysfunction valvular heart disease contributing to above 4. Continue gentle diuresis as before for further lower extremity edema pulmonary edema and chronic systolic dysfunction congestive heart failure although patient has significant chronic kidney disease which is likely exacerbated congestive heart failure as well 5. No further intervention of minimal elevation of troponin most consistent with ischemia rather than acute coronary syndrome 6. Ambulation and follow for any further significant symptoms and treatment of anemia which is the primary exacerbate her of heart failure and further adjustments of medications thereof as necessary  Signed, Arnoldo Hooker M.D. FACC

## 2014-10-29 LAB — CBC
HCT: 23.2 % — ABNORMAL LOW (ref 40.0–52.0)
HEMOGLOBIN: 7.5 g/dL — AB (ref 13.0–18.0)
MCH: 30.3 pg (ref 26.0–34.0)
MCHC: 32.4 g/dL (ref 32.0–36.0)
MCV: 93.5 fL (ref 80.0–100.0)
Platelets: 125 10*3/uL — ABNORMAL LOW (ref 150–440)
RBC: 2.48 MIL/uL — AB (ref 4.40–5.90)
RDW: 15.8 % — ABNORMAL HIGH (ref 11.5–14.5)
WBC: 5.2 10*3/uL (ref 3.8–10.6)

## 2014-10-29 MED ORDER — FAMOTIDINE 20 MG PO TABS
20.0000 mg | ORAL_TABLET | Freq: Two times a day (BID) | ORAL | Status: DC | PRN
  Administered 2014-10-29: 20 mg via ORAL
  Filled 2014-10-29: qty 1

## 2014-10-29 MED ORDER — METHYLPREDNISOLONE SODIUM SUCC 40 MG IJ SOLR
40.0000 mg | Freq: Every day | INTRAMUSCULAR | Status: DC
  Administered 2014-10-29 – 2014-10-30 (×2): 40 mg via INTRAVENOUS
  Filled 2014-10-29 (×2): qty 1

## 2014-10-29 MED ORDER — ALBUTEROL SULFATE (2.5 MG/3ML) 0.083% IN NEBU
2.5000 mg | INHALATION_SOLUTION | RESPIRATORY_TRACT | Status: DC
  Administered 2014-10-29 – 2014-10-31 (×10): 2.5 mg via RESPIRATORY_TRACT
  Filled 2014-10-29 (×12): qty 3

## 2014-10-29 NOTE — Progress Notes (Addendum)
3 L of oxygen. A fib. Hbg was 7.5. Up to chair and tolerated it well. No BMs. Pt reports no pain. But did report heartburn and received pepcid for it. MD Luberta Mutter was made aware that pt was very wheezy and nebs and solumedrol was ordered. Takes meds ok. A & O. Up to [facility] and tolerated it well. EF 45%. Hospice called to check in on pt. Family was updated on care. Pt has no further concerns at this time. Refused SCD.

## 2014-10-29 NOTE — Progress Notes (Signed)
[facility]iology [facility] Encounter Note  Patient: Terry Huerta / Admit Date: 10/24/2014 / Date of Encounter: 10/29/2014, 7:40 AM   Subjective: Shortness of breath and weakness although relatively stable and somewhat improved since admission  Review of Systems: Positive for: Shortness of breath and weakness Negative for: Vision change, hearing change, syncope, dizziness, nausea, vomiting,diarrhea, bloody stool, stomach pain, cough, congestion, diaphoresis, urinary frequency, urinary pain,skin lesions, skin rashes Others previously listed  Objective: Telemetry: Atrial fibrillation with controlled ventricular rate Physical Exam: Blood pressure 117/50, pulse 70, temperature 98.3 F (36.8 C), temperature source Oral, resp. rate 20, height  (1.702 m), weight 226 lb 12.8 oz (102.876 kg), SpO2 100 %. Body mass index is 35.51 kg/(m^2). General: Well developed, well nourished, in no acute distress. Head: Normocephalic, atraumatic, sclera non-icteric, no xanthomas, nares are without discharge. Neck: No apparent masses Lungs: Normal respirations with no wheezes, no rhonchi, no rales , basilar crackles   Heart: Irregular rate and rhythm, normal S1 S2, 2+ mitral murmur, no rub, no gallop, PMI is normal size and placement, carotid upstroke normal without bruit, jugular venous pressure normal Abdomen: Soft, non-tender, non-distended with normoactive bowel sounds. No hepatosplenomegaly. Abdominal aorta is normal size without bruit Extremities: 1+ edema, no clubbing, no cyanosis, no ulcers,  Peripheral: 2+ radial, 2+ femoral, 2+ dorsal pedal pulses Neuro: Alert   Moves all extremities spontaneously. Psych:  Responds to questions appropriately with a normal affect.   Intake/Output Summary (Last 24 hours) at 10/29/14 0740 Last data filed at 10/29/14 0600  Gross per 24 hour  Intake    603 ml  Output    886 ml  Net   -283 ml    Inpatient Medications:  . digoxin  0.125 mg Oral  QODAY  . pantoprazole  40 mg Oral BID  . sodium chloride  3 mL Intravenous Q12H  . torsemide  20 mg Oral BID   Infusions:     Labs:  Recent Labs  10/27/14 0532 10/28/14 0510  NA 141 136  K 3.4* 3.6  CL 103 101  CO2 30 28  GLUCOSE 99 100*  BUN 64* 57*  CREATININE 2.06* 2.09*  CALCIUM 7.8* 7.5*   No results for input(s): AST, ALT, ALKPHOS, BILITOT, PROT, ALBUMIN in the last 72 hours.  Recent Labs  10/29/14 0503  WBC 5.2  HGB 7.5*  HCT 23.2*  MCV 93.5  PLT 125*   No results for input(s): CKTOTAL, CKMB, TROPONINI in the last 72 hours. Invalid input(s): POCBNP No results for input(s): HGBA1C in the last 72 hours.   Weights: Filed Weights   10/28/14 0437 10/29/14 0600 10/29/14 0647  Weight: 223 lb 1.6 oz (101.197 kg) 226 lb 12.8 oz (102.876 kg) 226 lb 12.8 oz (102.876 kg)     Radiology/Studies:  US Venous Img Lower Unilateral Left  10/27/2014   CLINICAL DATA:  Left lower extremity swelling and pain for 1 week.  EXAM: LEFT LOWER EXTREMITY VENOUS DOPPLER ULTRASOUND  TECHNIQUE: Gray-scale sonography with graded compression, as well as color Doppler and duplex ultrasound were performed to evaluate the lower extremity deep venous systems from the level of the common femoral vein and including the common femoral, femoral, profunda femoral, popliteal and calf veins including the posterior tibial, peroneal and gastrocnemius veins when visible. The superficial great saphenous vein was also interrogated. Spectral Doppler was utilized to evaluate flow at rest and with distal augmentation maneuvers in the common femoral, femoral and popliteal veins.  COMPARISON:  None.  FINDINGS:  Contralateral Common Femoral Vein: Respiratory phasicity is normal and symmetric with the symptomatic side. No evidence of thrombus. Normal compressibility.  Common Femoral Vein: No evidence of thrombus. Normal compressibility, respiratory phasicity and response to augmentation.  Saphenofemoral Junction: No  evidence of thrombus. Normal compressibility and flow on color Doppler imaging.  Profunda Femoral Vein: No evidence of thrombus. Normal compressibility and flow on color Doppler imaging.  Femoral Vein: No evidence of thrombus. Normal compressibility, respiratory phasicity and response to augmentation.  Popliteal Vein: No evidence of thrombus. Normal compressibility, respiratory phasicity and response to augmentation.  Calf Veins: No evidence of thrombus. Normal compressibility and flow on color Doppler imaging.  Superficial Great Saphenous Vein: Proximal left GSV is dilated to nearly 2 cm without associated superficial thrombosis. Appearance suggests developing GSV venous insufficiency.  Venous Reflux:  None.  Other Findings:  None.  IMPRESSION: Negative for left lower extremity DVT.   Electronically Signed   By: Judie Petit.  Shick M.D.   On: 10/27/2014 17:35   Dg Chest Portable 1 View  10/24/2014   CLINICAL DATA:  Weakness, fatigue and bilateral leg swelling for 1 week. Shortness of breath. History of atrial fibrillation and stroke.  EXAM: PORTABLE CHEST - 1 VIEW  COMPARISON:  08/26/2014 and 11/06/2013.  FINDINGS: 1022 hr. Cardiomegaly and mild vascular congestion appear unchanged. There is no overt pulmonary edema, confluent airspace opacity or significant pleural effusion. The bones appear unchanged. Telemetry leads overlie the chest.  IMPRESSION: Stable chronic cardiomegaly and mild vascular congestion. No acute findings demonstrated.   Electronically Signed   By: Carey Bullocks M.D.   On: 10/24/2014 10:55     Assessment and Recommendation  81 y.o. male with known chronic nonvalvular atrial fibrillation previous history of stroke mixed hyperlipidemia chronic kidney disease stage III with acute episode of bleeding and anemia exacerbating Rondic systolic dysfunction congestive heart failure with elevated troponin consistent with demand ischemia and no current evidence of acute coronary syndrome 1. Abstain from  anticoagulation due to significant anemia and probable bleeding knowing patient does have stroke risk but risks of continuation of anticoagulation outweigh the benefits at this time 2. No change in current medical regimen including digoxin for heart rate control of atrial fibrillation although there are concerns of chronic kidney disease and elevated levels of digoxin and would consider changing to metoprolol if bradycardia or worsening chronic kidney disease occur although this can be done as an outpatient 3. No further cardiac diagnostics necessary at this time due to improvements of symptoms 4. Continue gentle diuresis as before for further lower extremity edema pulmonary edema and chronic systolic dysfunction congestive heart failure although patient has significant chronic kidney disease which is likely exacerbated congestive heart failure as well 5. No further intervention of minimal elevation of troponin most consistent with ischemia rather than acute coronary syndrome 6. Ambulation and follow for any further significant symptoms and treatment of anemia which is the primary exacerbate her of heart failure and further adjustments of medications and will consider outpatient adjustments of medication management next week   Signed, Arnoldo Hooker M.D. FACC

## 2014-10-29 NOTE — Progress Notes (Signed)
[facility] - [place] [facility]   PATIENT NAME: Terry Huerta    MR#:  [id number]  DATE OF BIRTH:  Apr 09, 1920  SUBJECTIVE 81 year old male patient admitted to ICU secondary to upper GI bleed.  Having melena for about 1 week. Patient found to have severely anemic with hemoglobin of 4. Received 4 units of blood transfusion.   Hemoglobin stable at 8.6. No further malena.. Patient moved out of ICU. Patient denies any other complaints. Seen today patient complains of mild shortness of breath. Has some wheezing.O2 sats 100% on 3 L. O2 saturations 100% on 3 L..   CHIEF COMPLAINT:   Chief Complaint  Patient presents with  . Shortness of Breath  . Fatigue  . Leg Swelling  . Melena    REVIEW OF SYSTEMS:    Review of Systems  Constitutional: Negative for fever and chills.  HENT: Negative for hearing loss.   Eyes: Negative for blurred vision, double vision and photophobia.  Respiratory: Negative for cough, hemoptysis and shortness of breath.   Cardiovascular: Negative for palpitations, orthopnea and leg swelling.  Gastrointestinal: Negative for vomiting, abdominal pain and diarrhea.  Genitourinary: Negative for dysuria and urgency.  Musculoskeletal: Negative for myalgias and neck pain.  Skin: Negative for rash.  Neurological: Negative for dizziness, focal weakness, seizures, weakness and headaches.  Psychiatric/Behavioral: Negative for memory loss. The patient does not have insomnia.     Nutrition: diet  Tolerating Diet: Tolerating PT:      DRUG ALLERGIES:  No Known Allergies  VITALS:  Blood pressure 117/50, pulse 70, temperature 98.3 F (36.8 C), temperature source Oral, resp. rate 20, height 5\' 7"  (1.702 m), weight 102.876 kg (226 lb 12.8 oz), SpO2 100 %.  PHYSICAL EXAMINATION:   Physical Exam  GENERAL:  81 y.o.-year-old patient lying in the bed with no acute distress.  Very hard of hearing. EYES: Pupils equal, round, reactive to light and  accommodation. No scleral icterus. Extraocular muscles intact.  HEENT: Head atraumatic, normocephalic. Oropharynx and nasopharynx clear.  NECK:  Supple, no jugular venous distention. No thyroid enlargement, no tenderness.  LUNGS: Normal breath sounds bilaterally, no wheezing, rales,rhonchi or crepitation. No use of accessory muscles of respiration.  CARDIOVASCULAR: S1, S2 normal. No murmurs, rubs, or gallops.  ABDOMEN: Soft, nontender, nondistended. Bowel sounds present. No organomegaly or mass.  EXTREMITIES: No pedal edema, cyanosis, or clubbing.  NEUROLOGIC: Cranial nerves II through XII are intact. Muscle strength 5/5 in all extremities. Sensation intact. Gait not checked.  PSYCHIATRIC: The patient is alert and oriented x 3.  SKIN: No obvious rash, lesion, or ulcer.    LABORATORY PANEL:   CBC  Recent Labs Lab 10/29/14 0503  WBC 5.2  HGB 7.5*  HCT 23.2*  PLT 125*   ------------------------------------------------------------------------------------------------------------------  Chemistries   Recent Labs Lab 10/24/14 0948  10/28/14 0510  NA 138  < > 136  K 3.9  < > 3.6  CL 99*  < > 101  CO2 28  < > 28  GLUCOSE 127*  < > 100*  BUN 95*  < > 57*  CREATININE 2.05*  < > 2.09*  CALCIUM 8.0*  < > 7.5*  AST 38  --   --   ALT 16*  --   --   ALKPHOS 52  --   --   BILITOT 0.6  --   --   < > = values in this interval not displayed. ------------------------------------------------------------------------------------------------------------------  Cardiac Enzymes  Recent Labs Lab 10/24/14 1645  TROPONINI 0.09*   ------------------------------------------------------------------------------------------------------------------  RADIOLOGY:  US Venous Img Lower Unilateral Left  10/27/2014   CLINICAL DATA:  Left lower extremity swelling and pain for 1 week.  EXAM: LEFT LOWER EXTREMITY VENOUS DOPPLER ULTRASOUND  TECHNIQUE: Gray-scale sonography with graded compression, as well  as color Doppler and duplex ultrasound were performed to evaluate the lower extremity deep venous systems from the level of the common femoral vein and including the common femoral, femoral, profunda femoral, popliteal and calf veins including the posterior tibial, peroneal and gastrocnemius veins when visible. The superficial great saphenous vein was also interrogated. Spectral Doppler was utilized to evaluate flow at rest and with distal augmentation maneuvers in the common femoral, femoral and popliteal veins.  COMPARISON:  None.  FINDINGS: Contralateral Common Femoral Vein: Respiratory phasicity is normal and symmetric with the symptomatic side. No evidence of thrombus. Normal compressibility.  Common Femoral Vein: No evidence of thrombus. Normal compressibility, respiratory phasicity and response to augmentation.  Saphenofemoral Junction: No evidence of thrombus. Normal compressibility and flow on color Doppler imaging.  Profunda Femoral Vein: No evidence of thrombus. Normal compressibility and flow on color Doppler imaging.  Femoral Vein: No evidence of thrombus. Normal compressibility, respiratory phasicity and response to augmentation.  Popliteal Vein: No evidence of thrombus. Normal compressibility, respiratory phasicity and response to augmentation.  Calf Veins: No evidence of thrombus. Normal compressibility and flow on color Doppler imaging.  Superficial Great Saphenous Vein: Proximal left GSV is dilated to nearly 2 cm without associated superficial thrombosis. Appearance suggests developing GSV venous insufficiency.  Venous Reflux:  None.  Other Findings:  None.  IMPRESSION: Negative for left lower extremity DVT.   Electronically Signed   By: Judie Petit.  Shick M.D.   On: 10/27/2014 17:35     ASSESSMENT AND PLAN:   1. Upper GI bleed with acute blood loss anemia and hypotension likely precipitated by her Eliquis. It is post 4 units of transfusion hemoglobin stable at this time received for 72 hours a  Protonix drip we will change the Protonix to by mouth 40 mg twice a day  No endoscopy is planned as per discussion with patient's son. Continue regular diet, h pylori test is positive,, started on treatment. i. 2, hypotension secondary to blood loss anemia resolved.t continue orsemide secondary to edema and CHF. Acute renal failure with CKD 3 due to ATN from hypotension resolving. Renal function is stable Chronic atrial fibrillation patient Eliquis is  stopped  Due to GI bleed.consulted cardiology regarding anticoagulation due to ongoing GI bleed.patient will be off anticoagulation.    Chronic systolic heart failure with low extremity edema; echo showed EF 45% with the dictated systolic function.  Wheezing continue nebulizers. Likely discharge tomorrow. Weakness continue physical therapy patient still needing a lot of Assistance  to go to bathroom ,son is very concerned about this so will continue physical therapy l ll the records are reviewed and case discussed with Care Management/Social Workerr. Management plans discussed with the patient, family and they are in agreement.  CODE STATUS:full  TOTAL TIME TAKING CARE OF THIS PATIENT: POSSIBLE D/C IN 3-4  DAYS, DEPENDING ON CLINICAL CONDITION.   Katha Hamming M.D on 10/29/2014 at 8:35 AM  Between 7am to 6pm - Pager - [phone]  After 6pm go to [email or web address] - password [email or web address] [facility]  [place] [facility]  Office  [phone]  CC: Primary care physician; Rolm Gala, MD

## 2014-10-29 NOTE — Progress Notes (Signed)
Pt reported wheezing- solumedrol and nebs ordered. Pt reported heartburn and pepcid was ordered. Pt has no further concerns at this time.

## 2014-10-30 ENCOUNTER — Inpatient Hospital Stay

## 2014-10-30 LAB — CBC
HCT: 25.5 % — ABNORMAL LOW (ref 40.0–52.0)
HEMOGLOBIN: 8.2 g/dL — AB (ref 13.0–18.0)
MCH: 30.4 pg (ref 26.0–34.0)
MCHC: 32 g/dL (ref 32.0–36.0)
MCV: 95.1 fL (ref 80.0–100.0)
Platelets: 140 10*3/uL — ABNORMAL LOW (ref 150–440)
RBC: 2.68 MIL/uL — ABNORMAL LOW (ref 4.40–5.90)
RDW: 15.7 % — AB (ref 11.5–14.5)
WBC: 5.4 10*3/uL (ref 3.8–10.6)

## 2014-10-30 LAB — BASIC METABOLIC PANEL
Anion gap: 8 (ref 5–15)
BUN: 44 mg/dL — ABNORMAL HIGH (ref 6–20)
CALCIUM: 7.5 mg/dL — AB (ref 8.9–10.3)
CO2: 28 mmol/L (ref 22–32)
CREATININE: 1.97 mg/dL — AB (ref 0.61–1.24)
Chloride: 99 mmol/L — ABNORMAL LOW (ref 101–111)
GFR, EST AFRICAN AMERICAN: 32 mL/min — AB (ref >60)
GFR, EST NON AFRICAN AMERICAN: 28 mL/min — AB (ref >60)
Glucose, Bld: 91 mg/dL (ref 65–99)
Potassium: 3.5 mmol/L (ref 3.5–5.1)
Sodium: 135 mmol/L (ref 135–145)

## 2014-10-30 MED ORDER — FUROSEMIDE 10 MG/ML IJ SOLN
40.0000 mg | Freq: Once | INTRAMUSCULAR | Status: AC
  Administered 2014-10-30: 40 mg via INTRAVENOUS
  Filled 2014-10-30: qty 4

## 2014-10-30 MED ORDER — CLARITHROMYCIN 500 MG PO TABS
500.0000 mg | ORAL_TABLET | Freq: Two times a day (BID) | ORAL | Status: DC
  Administered 2014-10-30 – 2014-10-31 (×3): 500 mg via ORAL
  Filled 2014-10-30 (×4): qty 1

## 2014-10-30 MED ORDER — AMOXICILLIN 500 MG PO CAPS
1000.0000 mg | ORAL_CAPSULE | Freq: Two times a day (BID) | ORAL | Status: DC
  Administered 2014-10-30 – 2014-10-31 (×3): 1000 mg via ORAL
  Filled 2014-10-30 (×4): qty 2

## 2014-10-30 MED ORDER — POLYETHYLENE GLYCOL 3350 17 G PO PACK
17.0000 g | PACK | Freq: Every day | ORAL | Status: DC
  Administered 2014-10-30: 17 g via ORAL
  Filled 2014-10-30 (×2): qty 1

## 2014-10-30 MED ORDER — DOCUSATE SODIUM 100 MG PO CAPS
100.0000 mg | ORAL_CAPSULE | Freq: Two times a day (BID) | ORAL | Status: DC
  Administered 2014-10-30 – 2014-10-31 (×2): 100 mg via ORAL
  Filled 2014-10-30 (×2): qty 1

## 2014-10-30 MED ORDER — SENNA 8.6 MG PO TABS
1.0000 | ORAL_TABLET | Freq: Every day | ORAL | Status: DC
  Administered 2014-10-30: 8.6 mg via ORAL
  Filled 2014-10-30 (×2): qty 1

## 2014-10-30 NOTE — Progress Notes (Signed)
Visit made. Patient seen sitting up in bed, alert and interactive, eating lunch, audible wheezing noted, oxygen at 3 L nasal cannula. Per chart review patient had an episode of increased shortness of breath and wheezing and has received solumedrol and nebulizer treatements. Edema to extremities continues, marked increased noted in right arm, patient reports this started yesterday. Patient's son Terry Huerta present during visit and voiced his concern regarding his father's extended hospital stay. He had been told that his father would not discharge until his edema and wheezing has resolved, he feels these have ben present for "years". Writer provided emotional support. Patient's appetite is good and he reports a bowel movement this morning, he was able to get up to the [facility] for this. He continues with Physical Therapy as tolerated. Writer spoke with attending physician Dr. Clent Ridges and requested that she speak with [name] at his request to discuss his concerns. Will contine to follow and update hospice team.  Dayna Barker RN, BSN, [facility] [place], [facility] [phone] c

## 2014-10-30 NOTE — Plan of Care (Signed)
Pt. Slept peacefully during the night. 3-4+ edema continues to scrotum and BLE. BUE have 2-3+ edema. No acute distress noted. Pt. A&O x4.  No signs or c/o pain noted. Audible wheezing noted. Pt. Continues with neb treatments. VSS.

## 2014-10-30 NOTE — Progress Notes (Addendum)
[facility] - [place] at [facility]   PATIENT NAME: Terry Huerta    MR#:  [id number]  DATE OF BIRTH:  09-24-1919  SUBJECTIVE 81 year old male patient admitted to ICU 5/31secondary to upper GI bleed. Having melena for about 1 week. Patient found to have severely anemic with hemoglobin of 4.  Received 4 units of blood transfusion.   Hemoglobin stable at 8.6. No further malena. Patient moved out of ICU 6/1.  Has had shortness of breath on floor requiring 02.  CHIEF COMPLAINT:   Chief Complaint  Patient presents with  . Shortness of Breath  . Fatigue  . Leg Swelling  . Melena   Short of breath with wheezing. No chest pain. No further bleeding noted.   REVIEW OF SYSTEMS:    Review of Systems  Constitutional: Negative for fever.  Respiratory: Positive for shortness of breath and wheezing.   Cardiovascular: Negative for chest pain and palpitations.  Gastrointestinal: Negative for nausea, vomiting and abdominal pain.  Genitourinary: Negative for dysuria.   DRUG ALLERGIES:  No Known Allergies  VITALS:  Blood pressure 114/64, pulse 75, temperature 98.1 F (36.7 C), temperature source Oral, resp. rate 18, height 5\' 7"  (1.702 m), weight 102.876 kg (226 lb 12.8 oz), SpO2 100 %.  PHYSICAL EXAMINATION:   Physical Exam  GENERAL:  81 y.o.-year-old patient lying in the bed with no acute distress.  Very hard of hearing. EYES: Pupils equal, round, reactive to light and accommodation. No scleral icterus. Extraocular muscles intact.  HEENT: Head atraumatic, normocephalic. Oropharynx and nasopharynx clear.  NECK:  Supple, no jugular venous distention. No thyroid enlargement, no tenderness.  LUNGS: upper airway expiratory wheeze, fair air movement, no crackles or rhonchi CARDIOVASCULAR: S1, S2 normal. No murmurs, rubs, or gallops.  ABDOMEN: Soft, nontender, nondistended. Bowel sounds present. No organomegaly or mass.  EXTREMITIES: 3+ pedal edema to thighs, no  cyanosis, or clubbing. 2+ edema of right arm  NEUROLOGIC: Cranial nerves II through XII are intact. Muscle strength 5/5 in all extremities. Sensation intact. Gait not checked.  PSYCHIATRIC: The patient is alert and oriented x 3.  SKIN: No obvious rash, lesion, or ulcer.   LABORATORY PANEL:   CBC  Recent Labs Lab 10/30/14 0841  WBC 5.4  HGB 8.2*  HCT 25.5*  PLT 140*   ------------------------------------------------------------------------------------------------------------------  Chemistries   Recent Labs Lab 10/24/14 0948  10/30/14 0841  NA 138  < > 135  K 3.9  < > 3.5  CL 99*  < > 99*  CO2 28  < > 28  GLUCOSE 127*  < > 91  BUN 95*  < > 44*  CREATININE 2.05*  < > 1.97*  CALCIUM 8.0*  < > 7.5*  AST 38  --   --   ALT 16*  --   --   ALKPHOS 52  --   --   BILITOT 0.6  --   --   < > = values in this interval not displayed. ------------------------------------------------------------------------------------------------------------------  Cardiac Enzymes  Recent Labs Lab 10/24/14 1645  TROPONINI 0.09*   ------------------------------------------------------------------------------------------------------------------  RADIOLOGY:  No results found.   ASSESSMENT AND PLAN:   1. Upper GI bleed with acute blood loss anemia and hypotension  - eliquis discontinued - now sp 4 u prbc's with stable hgb of 8.2   2. h. Pylori serology +,  - no further endoscopy planned - continue BID PPI - start antibiotics as recommended  3. Hypotension - BP still fairly low, no antihypertensives  4. Shortness of breath - CXR today - significant LE edema, probable pulm edema as well - continue nebulizer, will discontinue steroids in light of GI bleeding and edema  5. A- fib - rate is controlled - continue digoxin - no anticoagulation due to GI bleed  6. Chronic systolic heart failure EF 45% - significant edema and likely pulmonary edema as well - continue torsemide - IV  lasix today to help with breathing  7. Acute renal failure on CKD 3 - ATN from hypotension, improving. - monitor closely during diuresis  8. Deconditioning - PT evaluation pending  9. RUE swelling - doppler today to RU DVT  Dispo: per PT recommendations. Hospice following.  ll the records are reviewed and case discussed with Care Management/Social Workerr. Management plans discussed with the patient, family and they are in agreement.  CODE STATUS: DNR  TOTAL TIME TAKING CARE OF THIS PATIENT: 40 minute POSSIBLE D/C IN 3-4  DAYS, DEPENDING ON CLINICAL CONDITION.   Elby Showers M.D on 10/30/2014 at 12:49 PM  Between 7am to 6pm - Pager - [phone] After 6pm go to [email or web address] - password [email or web address]  [facility]  Office  [phone]  CC: Primary care physician; Rolm Gala, MD

## 2014-10-30 NOTE — Care Management (Addendum)
It is reported that patient had respiratory issues over the weekend which prevented discharge.

## 2014-10-30 NOTE — Progress Notes (Signed)
Initial Nutrition Assessment  INTERVENTION: Meals and Snacks: Cater to patient preferences Medical Food Supplement Therapy: will send Magic Cup at lunch and dinner for added nutrition  NUTRITION DIAGNOSIS:  None at this time  GOAL:  Patient will meet greater than or equal to 90% of their needs  MONITOR:   (Energy Intake, Digestive system, Anthropometrics)  REASON FOR ASSESSMENT:   (Length of Stay)    ASSESSMENT:  Pt admitted with upper GI bleed, severely anemic s/p 4 units of blood transfusions and h.pylori positive.   PMHx:  Past Medical History  Diagnosis Date  . Hypertension   . Hyperlipidemia   . Stroke   . Myocardial infarction   . Atrial fibrillation   . Congestive heart failure   . GERD (gastroesophageal reflux disease)   . Chronic anticoagulation   . Hypokalemia   . Dementia   . Hypoxia   . Dysrhythmia   . Shortness of breath dyspnea    Current Nutrition: pt reports eating lunch today including vegetables, fruit and starch with tea. Pt reports eating a good breakfast this am as well. Recorded PO intake since admission has been >60% of meals in recent days.  Nutrition Prior to Admission: pt reports po intake PTA has been healthy eating 3 meals per day.  Medications: Colace, Protonix, Senokot, Miralax Labs:  Electrolyte and Renal Profile:  Recent Labs Lab 10/27/14 0532 10/28/14 0510 10/30/14 0841  BUN 64* 57* 44*  CREATININE 2.06* 2.09* 1.97*  NA 141 136 135  K 3.4* 3.6 3.5   Glucose Profile: No results for input(s): GLUCAP in the last 72 hours. Protein Profile:  Recent Labs Lab 10/24/14 0948  ALBUMIN 2.6*   Nutritional Anemia Profile:  CBC Latest Ref Rng 10/30/2014 10/29/2014 10/26/2014  WBC 3.8 - 10.6 K/uL 5.4 5.2 10.8(H)  Hemoglobin 13.0 - 18.0 g/dL 8.2(L) 7.5(L) 8.6(L)  Hematocrit 40.0 - 52.0 % 25.5(L) 23.2(L) 24.9(L)  Platelets 150 - 440 K/uL 140(L) 125(L) 145(L)    Anthropometrics: Pt reports likely weight gain. RD notes 4+ edema on  BLE and 2-3+ edema on BUE.  Height:  Ht Readings from Last 1 Encounters:  10/24/14 5\' 7"  (1.702 m)    Weight:  Wt Readings from Last 1 Encounters:  10/29/14 226 lb 12.8 oz (102.876 kg)    Wt Readings from Last 10 Encounters:  10/29/14 226 lb 12.8 oz (102.876 kg)  05/08/14 203 lb (92.08 kg)    BMI:  Body mass index is 35.51 kg/(m^2).  Diet Order:  DIET SOFT Room service appropriate?: Yes; Fluid consistency:: Thin  EDUCATION NEEDS:  No education needs identified at this time   Intake/Output Summary (Last 24 hours) at 10/30/14 1550 Last data filed at 10/30/14 1546  Gross per 24 hour  Intake    720 ml  Output   1101 ml  Net   -381 ml    Last BM:  6/6  LOW Care Level  Leda Quail, RD, LDN Pager [phone]

## 2014-10-30 NOTE — Progress Notes (Signed)
PT Cancellation Note  Patient Details Name: Darwyn Calendine MRN: [id number] DOB: [birth year]   Cancelled Treatment:    Reason Eval/Treat Not Completed: Other (comment) (Patient currently eating breakfast; will re-attempt at later time/date.  Noted with increased edema to R UE during interaction; RN informed/aware and to discuss with rounding physician.  May order doppler to rule out DVT; if order placed, will hold until test complete/results received.  Will continue to follow and complete treatment as appropriate.)  Lailoni Baquera H. Manson Passey, PT, DPT 10/30/2014, 9:17 AM [phone]

## 2014-10-30 NOTE — Progress Notes (Signed)
Alert and oriented. Afib on tele. NO complaints of pain. Patient has significant edema in BLE and RUE that is wheeping. Ultrasound of right arm has been ordered, not completed at this time. Patient is wheezy and has dyspnea with exertion. Dose of IV lasix given. Will continue to monitor.

## 2014-10-31 ENCOUNTER — Inpatient Hospital Stay

## 2014-10-31 LAB — BASIC METABOLIC PANEL
Anion gap: 6 (ref 5–15)
BUN: 47 mg/dL — AB (ref 6–20)
CALCIUM: 7.2 mg/dL — AB (ref 8.9–10.3)
CO2: 27 mmol/L (ref 22–32)
Chloride: 98 mmol/L — ABNORMAL LOW (ref 101–111)
Creatinine, Ser: 1.83 mg/dL — ABNORMAL HIGH (ref 0.61–1.24)
GFR calc Af Amer: 35 mL/min — ABNORMAL LOW (ref >60)
GFR calc non Af Amer: 30 mL/min — ABNORMAL LOW (ref >60)
GLUCOSE: 106 mg/dL — AB (ref 65–99)
Potassium: 3.8 mmol/L (ref 3.5–5.1)
Sodium: 131 mmol/L — ABNORMAL LOW (ref 135–145)

## 2014-10-31 LAB — CBC
HCT: 23.7 % — ABNORMAL LOW (ref 40.0–52.0)
HEMOGLOBIN: 8 g/dL — AB (ref 13.0–18.0)
MCH: 31.6 pg (ref 26.0–34.0)
MCHC: 33.8 g/dL (ref 32.0–36.0)
MCV: 93.4 fL (ref 80.0–100.0)
PLATELETS: 126 10*3/uL — AB (ref 150–440)
RBC: 2.54 MIL/uL — AB (ref 4.40–5.90)
RDW: 15.4 % — AB (ref 11.5–14.5)
WBC: 5.4 10*3/uL (ref 3.8–10.6)

## 2014-10-31 MED ORDER — CLARITHROMYCIN 500 MG PO TABS: 500.0000 mg | ORAL_TABLET | Freq: Two times a day (BID) | ORAL | Status: AC

## 2014-10-31 MED ORDER — FUROSEMIDE 10 MG/ML IJ SOLN: 40.0000 mg | Freq: Two times a day (BID) | INTRAMUSCULAR | Status: DC

## 2014-10-31 MED ORDER — AMOXICILLIN 500 MG PO CAPS: 1000.0000 mg | ORAL_CAPSULE | Freq: Two times a day (BID) | ORAL | Status: AC

## 2014-10-31 MED ORDER — ONDANSETRON HCL 4 MG PO TABS: 4.0000 mg | ORAL_TABLET | Freq: Four times a day (QID) | ORAL | Status: AC | PRN

## 2014-10-31 MED ORDER — DOCUSATE SODIUM 100 MG PO CAPS: 100.0000 mg | ORAL_CAPSULE | Freq: Two times a day (BID) | ORAL | Status: AC

## 2014-10-31 MED ORDER — ACETAMINOPHEN 325 MG PO TABS: 650.0000 mg | ORAL_TABLET | Freq: Four times a day (QID) | ORAL | Status: AC | PRN

## 2014-10-31 MED ORDER — ALUM & MAG HYDROXIDE-SIMETH 200-200-20 MG/5ML PO SUSP: 30.0000 mL | Freq: Two times a day (BID) | ORAL | Status: AC | PRN

## 2014-10-31 MED ORDER — PANTOPRAZOLE SODIUM 40 MG PO TBEC: 40.0000 mg | DELAYED_RELEASE_TABLET | Freq: Two times a day (BID) | ORAL | Status: AC

## 2014-10-31 NOTE — Discharge Summary (Signed)
[facility] Physicians - [place] at [facility]  DISCHARGE SUMMARY   PATIENT NAME: Terry Huerta    MR#:  [id number]  DATE OF BIRTH:  May 11, 1944  DATE OF ADMISSION:  10/24/2014 ADMITTING PHYSICIAN: Milagros Loll, MD  DATE OF DISCHARGE: 10/31/2014  PRIMARY CARE PHYSICIAN: Rolm Gala, MD    ADMISSION DIAGNOSIS:  Gastrointestinal hemorrhage, unspecified gastritis, unspecified gastrointestinal hemorrhage type [K92.2]  DISCHARGE DIAGNOSIS:  Principal Problem:   Acute blood loss anemia Active Problems:   GI bleed   Acute renal failure   Pressure ulcer   SECONDARY DIAGNOSIS:   Past Medical History  Diagnosis Date  . Hypertension   . Hyperlipidemia   . Stroke   . Myocardial infarction   . Atrial fibrillation   . Congestive heart failure   . GERD (gastroesophageal reflux disease)   . Chronic anticoagulation   . Hypokalemia   . Dementia   . Hypoxia   . Dysrhythmia   . Shortness of breath dyspnea     HOSPITAL COURSE:   1. Upper GI bleed with acute blood loss anemia and hypotension: Initial hemoglobin 4.9. Possibly due to helicobactor pylori. He was followed by gastroenterology during this hospitalization. Family decided against endoscopy. 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 He is on treatment course with clarithromycin and amoxicillin for 14 days as well as Protonix twice a day for 14 days and then once a day from then on out. Eliquis was discontinued. Aspirin also discontinued. He is status post transfusion of 4 units of packed red blood cells with a stable hemoglobin of 8. No further bleeding noted. Tolerating regular diet  2. Hypotension: Blood pressure has remained fairly low throughout the hospitalization. Discontinued Coreg, enalapril. Torsemide was held but he began to accumulate significant amounts of interstitial fluid so this was restarted.  3. Acute on chronic systolic heart failure exacerbation: 2-D echocardiogram showing ejection  fraction 45%. Chest x-ray showing mild interstitial edema. He does have dependent edema and really anasarca. No signs of pneumonia. Have started Lasix 40 mg twice a day if blood pressure and renal function will tolerate. Oxygen saturation has been excellent on 2 L via nasal cannula. On examination wheezes seem to be more in the upper airway  4. A- fib: Rate is controlled on digoxin. Have stopped anticoagulation due to GI bleeding. Stopped Coreg due to hypotension.  5. Acute renal failure on CKD 3: Baseline creatinine about 1.35 with GFR 46. On presentation creatinine is 2.5 and has remained relatively stable on discharge is 1.8. This is likely ATN from hypotension. Will need to continue with diuresis for comfort to relieve anasarca and also to reduce pulmonary edema and facilitate respiration. Monitor renal function carefully. Electrolytes are stable  6. Deconditioning: PT evaluation pending. Patient severely deconditioned and would benefit from continued physical therapy.  7. RUE swelling: Doppler today to RU DVT. Result is pending. He does have anasarca and the right upper extremity swelling may be related. Would like to see if there is a DVT present. Will not be able to anticoagulate however due to GI bleeding   DISCHARGE CONDITIONS:   Back to home place with hospice following  CONSULTS OBTAINED:  Treatment Team:  Christena Deem, MD Alwyn Pea, MD Lamar Blinks, MD  DRUG ALLERGIES:  No Known Allergies  DISCHARGE MEDICATIONS:   Current Discharge Medication List    START taking these medications   Details  acetaminophen (TYLENOL) 325 MG tablet Take 2 tablets (650 mg total) by mouth every 6 (  six) hours as needed for mild pain (or Fever >/= 101). Qty: 60 tablet, Refills: 0    alum & mag hydroxide-simeth (MAALOX/MYLANTA) 200-200-20 MG/5ML suspension Take 30 mLs by mouth 2 (two) times daily as needed for indigestion or heartburn. Qty: 355 mL, Refills: 0    amoxicillin  (AMOXIL) 500 MG capsule Take 2 capsules (1,000 mg total) by mouth every 12 (twelve) hours. Qty: 26 capsule, Refills: 0    clarithromycin (BIAXIN) 500 MG tablet Take 1 tablet (500 mg total) by mouth every 12 (twelve) hours. Qty: 26 tablet, Refills: 0    docusate sodium (COLACE) 100 MG capsule Take 1 capsule (100 mg total) by mouth 2 (two) times daily. Qty: 10 capsule, Refills: 0    ondansetron (ZOFRAN) 4 MG tablet Take 1 tablet (4 mg total) by mouth every 6 (six) hours as needed for nausea. Qty: 20 tablet, Refills: 0    pantoprazole (PROTONIX) 40 MG tablet Take 1 tablet (40 mg total) by mouth 2 (two) times daily. Qty: 60 tablet, Refills: 0      CONTINUE these medications which have NOT CHANGED   Details  carvedilol (COREG) 3.125 MG tablet Take 1.5625 mg by mouth 2 (two) times daily with a meal.    digoxin (LANOXIN) 0.125 MG tablet Take 0.125 mg by mouth every other day. Pt takes every other day (on even days).    guaiFENesin-dextromethorphan (ROBITUSSIN DM) 100-10 MG/5ML syrup Take 5-10 mLs by mouth every 4 (four) hours as needed for cough.    loratadine (CLARITIN) 10 MG tablet Take 10 mg by mouth daily.    Multiple Vitamins-Iron (MULTIVITAMINS WITH IRON) TABS tablet Take 1 tablet by mouth daily.    nitroGLYCERIN (NITROSTAT) 0.4 MG SL tablet Place 0.4 mg under the tongue every 5 (five) minutes as needed for chest pain.    torsemide (DEMADEX) 20 MG tablet Take 20 mg by mouth 2 (two) times daily.      STOP taking these medications     apixaban (ELIQUIS) 2.5 MG TABS tablet      aspirin EC 81 MG tablet      enalapril (VASOTEC) 2.5 MG tablet          DISCHARGE INSTRUCTIONS:     DIET:  Cardiac diet  DISCHARGE CONDITION:  Serious  ACTIVITY:  Activity as tolerated  OXYGEN:  Home Oxygen: Yes.     Oxygen Delivery: 2 liters/min via Patient connected to nasal cannula oxygen  DISCHARGE LOCATION:  nursing home   If you experience worsening of your admission  symptoms, develop shortness of breath, life threatening emergency, suicidal or homicidal thoughts you must seek medical attention immediately by calling 911 or calling your MD immediately  if symptoms less severe.  You Must read complete instructions/literature along with all the possible adverse reactions/side effects for all the Medicines you take and that have been prescribed to you. Take any new Medicines after you have completely understood and accpet all the possible adverse reactions/side effects.   Please note  You were cared for by a hospitalist during your hospital stay. If you have any questions about your discharge medications or the care you received while you were in the hospital after you are discharged, you can call the unit and asked to speak with the hospitalist on call if the hospitalist that took care of you is not available. Once you are discharged, your primary care physician will handle any further medical issues. Please note that NO REFILLS for any discharge medications will be authorized  once you are discharged, as it is imperative that you return to your primary care physician (or establish a relationship with a primary care physician if you do not have one) for your aftercare needs so that they can reassess your need for medications and monitor your lab values.   Today   CHIEF COMPLAINT:   Chief Complaint  Patient presents with  . Shortness of Breath  . Fatigue  . Leg Swelling  . Melena    HISTORY OF PRESENT ILLNESS:  Cristen Murcia is a 81 y.o. male with a known history of atrial fibrillation on Eliquis, hypertension, chronic systolic CHF, anemia of chronic disease presents to the emergency room from his nursing home with fatigue. He has also had black tarry stools for 1 week which was confirmed in the emergency room. No frank blood. Blood pressure is low in systolic of 90s. Patient hasn't noticed any frank blood. No abdominal pain, nausea, vomiting. Not on any iron  supplements. Never had upper GI endoscopy. Chronic shortness of breath.  VITAL SIGNS:  Blood pressure 121/54, pulse 93, temperature 98 F (36.7 C), temperature source Oral, resp. rate 20, height 5\' 7"  (1.702 m), weight 99.519 kg (219 lb 6.4 oz), SpO2 100 %.  I/O:   Intake/Output Summary (Last 24 hours) at 10/31/14 1027 Last data filed at 10/31/14 0817  Gross per 24 hour  Intake    720 ml  Output   1700 ml  Net   -980 ml    PHYSICAL EXAMINATION:   GENERAL: 81 y.o.-year-old patient lying in the bed with no acute distress. Very hard of hearing. EYES: Pupils equal, round, reactive to light and accommodation. No scleral icterus. Extraocular muscles intact.  HEENT: Head atraumatic, normocephalic. Oropharynx and nasopharynx clear.  NECK: Supple, no jugular venous distention. No thyroid enlargement, no tenderness.  LUNGS: upper airway expiratory wheeze, fair air movement, no crackles or rhonchi CARDIOVASCULAR: S1, S2 normal. No murmurs, rubs, or gallops.  ABDOMEN: Soft, nontender, nondistended. Bowel sounds present. No organomegaly or mass.  EXTREMITIES: 3+ pedal edema to thighs, no cyanosis, or clubbing. 2+ edema of right arm  NEUROLOGIC: Cranial nerves II through XII are intact. Muscle strength 5/5 in all extremities. Sensation intact. Gait not checked.  PSYCHIATRIC: The patient is alert and oriented x 3.  SKIN: No obvious rash, lesion, or ulcer.   DATA REVIEW:   CBC  Recent Labs Lab 10/31/14 0417  WBC 5.4  HGB 8.0*  HCT 23.7*  PLT 126*    Chemistries   Recent Labs Lab 10/31/14 0417  NA 131*  K 3.8  CL 98*  CO2 27  GLUCOSE 106*  BUN 47*  CREATININE 1.83*  CALCIUM 7.2*    Cardiac Enzymes  Recent Labs Lab 10/24/14 1645  TROPONINI 0.09*    Microbiology Results  No results found for this or any previous visit.  RADIOLOGY:  Dg Chest Port 1 View  10/30/2014   CLINICAL DATA:  Shortness of breath for 4-5 days  EXAM: PORTABLE CHEST - 1 VIEW   COMPARISON:  10/24/2014  FINDINGS: There is moderate cardiac enlargement. Small bilateral pleural effusions are identified. There is mild interstitial edema. No airspace consolidation.  IMPRESSION: 1. Mild CHF.   Electronically Signed   By: Signa Kell M.D.   On: 10/30/2014 14:20    EKG:   Orders placed or performed during the hospital encounter of 10/24/14  . ED EKG  (if patient has PMH of COPD)  . ED EKG  (if patient has PMH  of COPD)  . ED EKG  . ED EKG  . EKG 12-Lead  . EKG 12-Lead      Management plans discussed with the patient, family and they are in agreement.  CODE STATUS:     Code Status Orders        Start     Ordered   10/24/14 1428  Do not attempt resuscitation (DNR)   Continuous    Question Answer Comment  In the event of cardiac or respiratory ARREST Do not call a "code blue"   In the event of cardiac or respiratory ARREST Do not perform Intubation, CPR, defibrillation or ACLS   In the event of cardiac or respiratory ARREST Use medication by any route, position, wound care, and other measures to relive pain and suffering. May use oxygen, suction and manual treatment of airway obstruction as needed for comfort.      10/24/14 1427    Advance Directive Documentation        Most Recent Value   Type of Advance Directive  Out of facility DNR (pink MOST or yellow form)   Pre-existing out of facility DNR order (yellow form or pink MOST form)     "MOST" Form in Place?        TOTAL TIME TAKING CARE OF THIS PATIENT: 55 minutes.    Elby Showers M.D on 10/31/2014 at 10:27 AM  Between 7am to 6pm - Pager - [phone]  After 6pm go to [email or web address] - password [email or web address] [facility]  [place] [facility]  Office  [phone]  CC: Primary care physician; Rolm Gala, MD

## 2014-10-31 NOTE — Clinical Social Work Note (Signed)
CSW notified pt, pt's son [name], [employer], and RN that pt would DC back resuming his hospice following.  CSW signing off.

## 2014-10-31 NOTE — Progress Notes (Signed)
Report called to [facility] at [facility]. IV and tele discontinued. Patient is dressed and ready to go. EMS has been called. Discharge paperwork given to son per request. CHF education given.

## 2014-10-31 NOTE — Progress Notes (Signed)
Visit made. Patient seen sitting up in bed, alert and oriented, son [name] at bedside. Audible wheezing noted, oxygen in place at 3 liters via nasal cannula. He continues with weeping edema to his right arm. Doppler of this arm was performed this morning and is negative for a DVT. Bilateral lower extremity edema as well as scrotal edema continue. Staff RN leah notified and will place foam dressing to weeping area for protection and absorption. Per patient and chart notes he is ready for discharge back to [facility] today and will continue with hospice services. Patient will require a BSC as he is not ambulalting to the bathroom at this time. Of note patient has not ambulated since admission and remains weak and in need of assistance to get up out of bed. He denied the need for a hospital bed, and reports that he currently sleeps in his recliner and is looking forward to returning to it.   Hospital discharge summary faxed to triage. Hospice team notified of planned discharge and equipment needs. BSC ordered for delivery today, patient and [name] both aware. Patient to transfer via EMS with portable DNR in place. Dayna Barker RN, BSN, [facility] [place], [facility] [phone] c

## 2014-10-31 NOTE — Care Management (Signed)
Patient for discharge back to [facility] with hospice service after doppler.  Updated Terry Huerta

## 2014-10-31 NOTE — Progress Notes (Signed)
PT Cancellation Note  Patient Details Name: Cephus Bieschke MRN: [id number] DOB: [birth year]   Cancelled Treatment:    Reason Eval/Treat Not Completed: Other (comment) (Pending imaging for R UE DVT. PT CI for mobility currently) Elizabeth Palau, PT, DPT [phone]  Alaynah Schutter 10/31/2014, 11:13 AM

## 2014-12-25 DEATH — deceased

## 2015-02-23 IMAGING — CT CT HEAD WITHOUT CONTRAST
2 series · 14 of 30 positions shown, 16 images · non-contrast
Comparison: CT head 03/19/2011

CLINICAL DATA: Fell and hit head.  On Coumadin.

EXAM:
CT HEAD WITHOUT CONTRAST
TECHNIQUE: Contiguous axial images were obtained from the base of the skull
through the vertex without intravenous contrast.

[Series 2: head wo · axial · 0.46mm/px · z∈[+675,+783]mm · 6 of 34 slices shown, 8 images]
[im 5/34  brain]
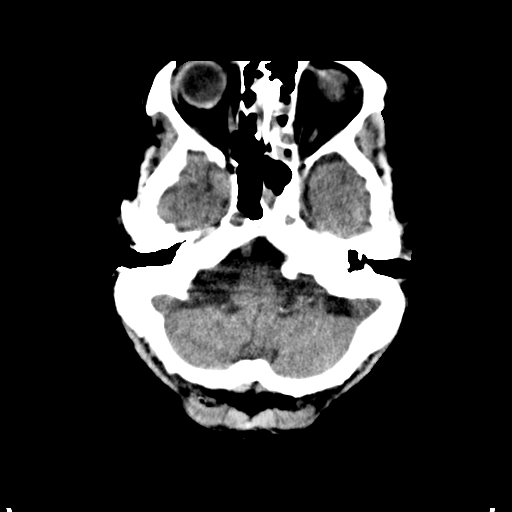
[im 5/34  bone]
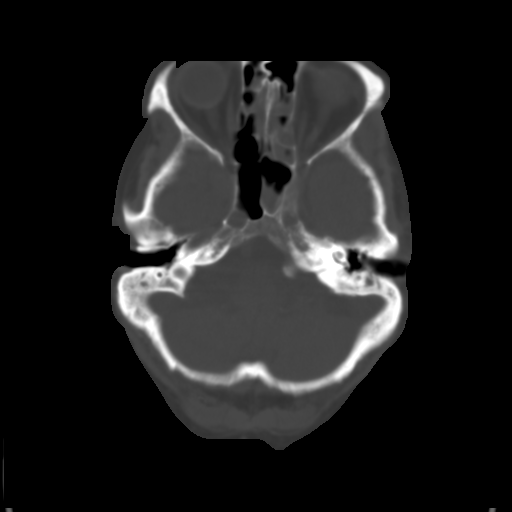
[im 10/34  brain]
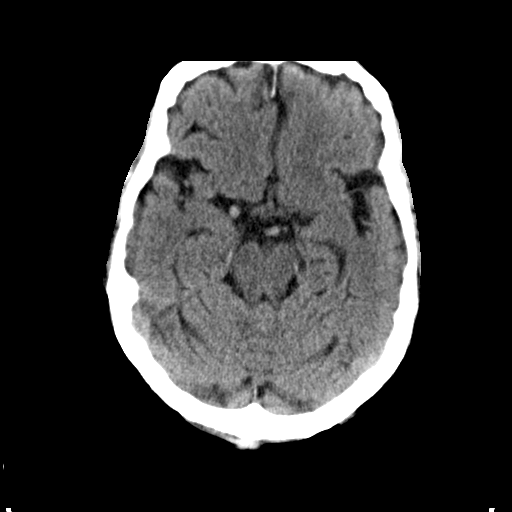
[im 15/34  brain]
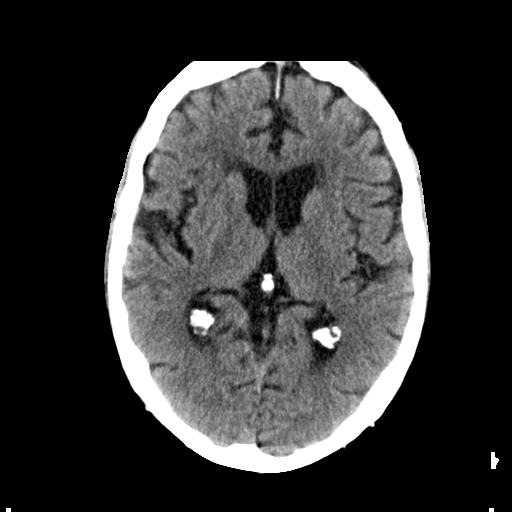
[im 19/34  brain]
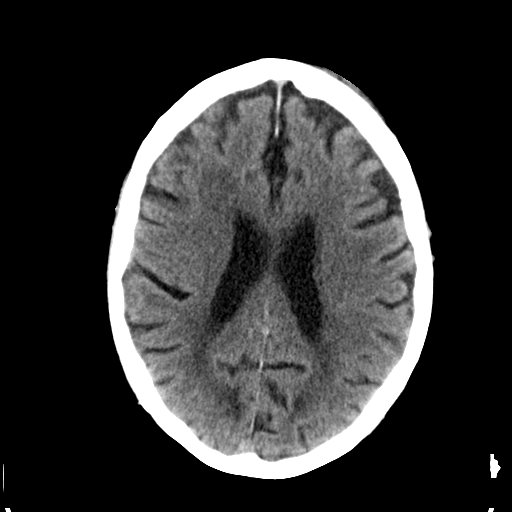
[im 24/34  brain]
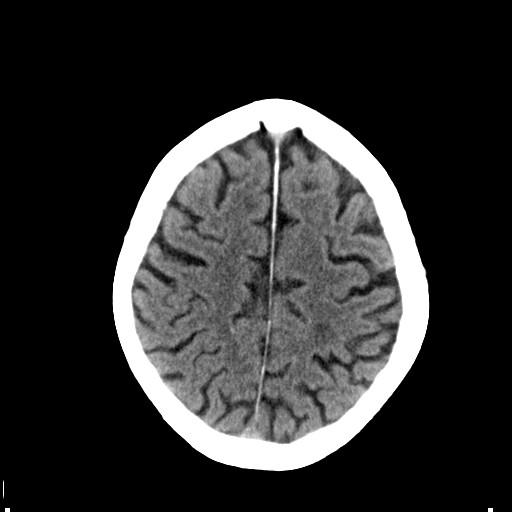
[im 24/34  bone]
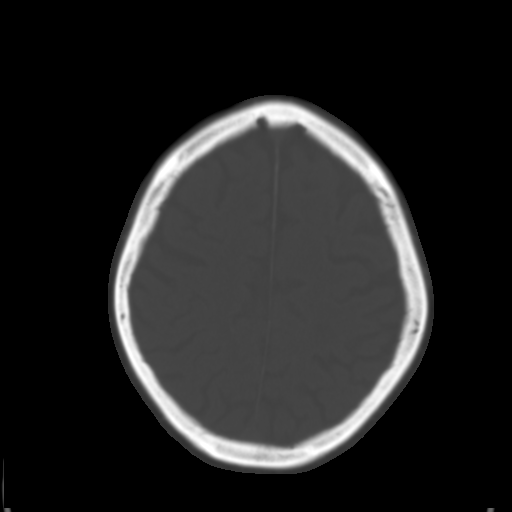
[im 29/34  brain]
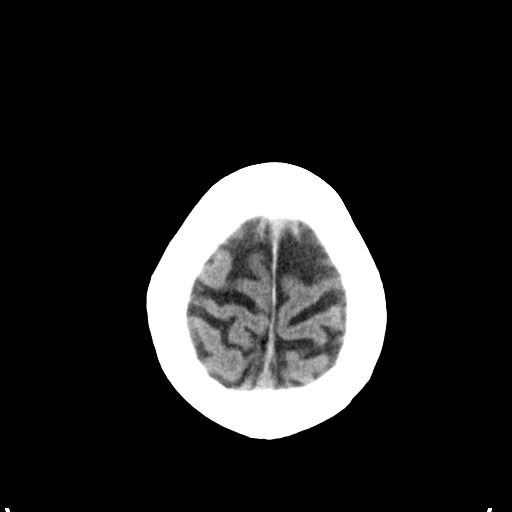

[Series 3: head bone · axial · 0.46mm/px · z∈[+669,+792]mm · 8 of 102 slices shown]
[im 10/102  bone]
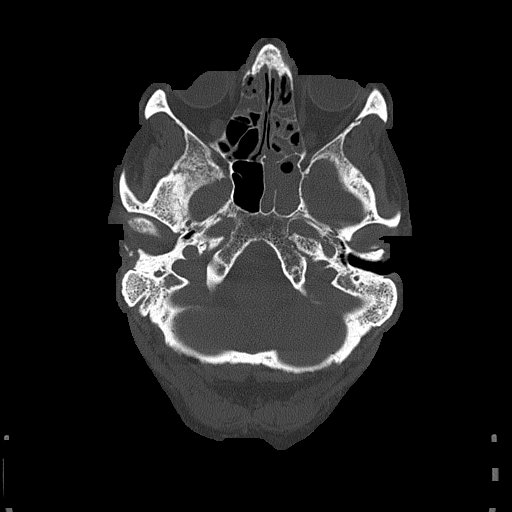
[im 20/102  bone]
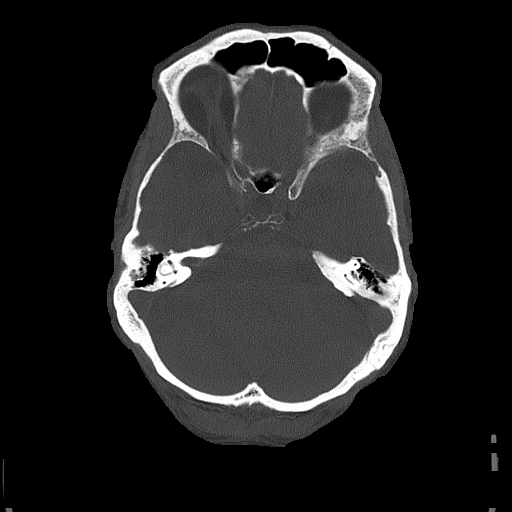
[im 34/102  bone]
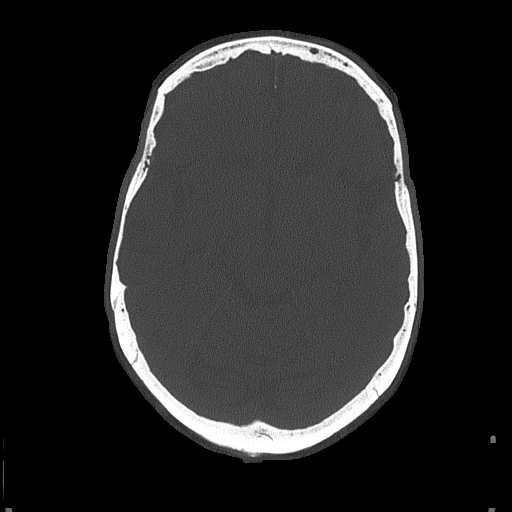
[im 44/102  bone]
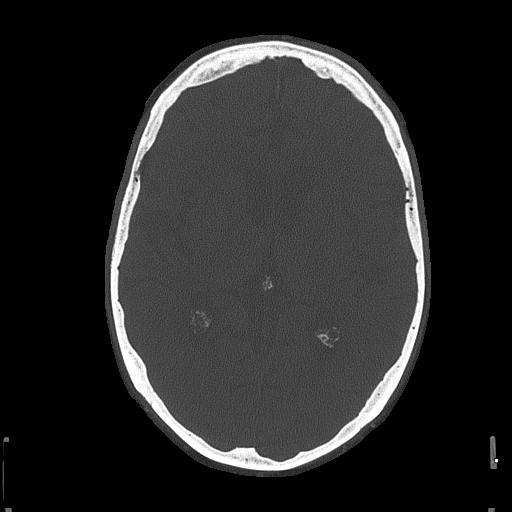
[im 58/102  bone]
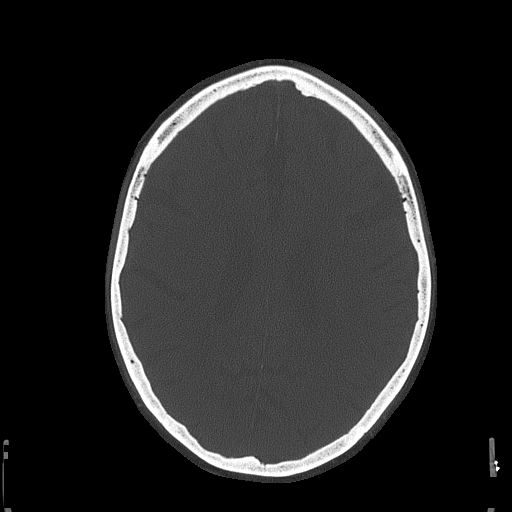
[im 68/102  bone]
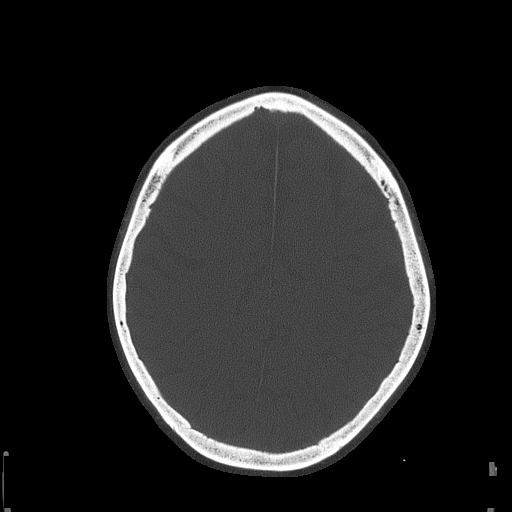
[im 82/102  bone]
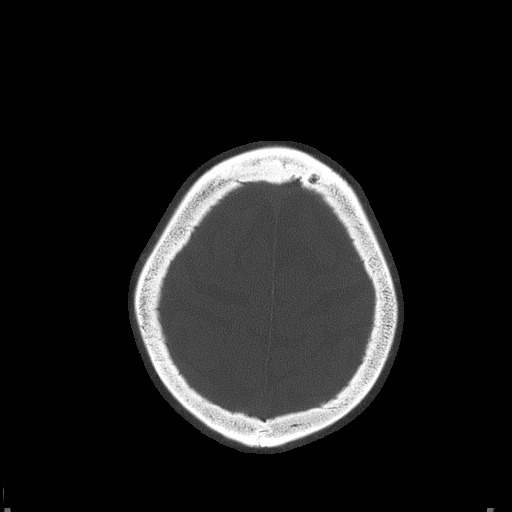
[im 92/102  bone]
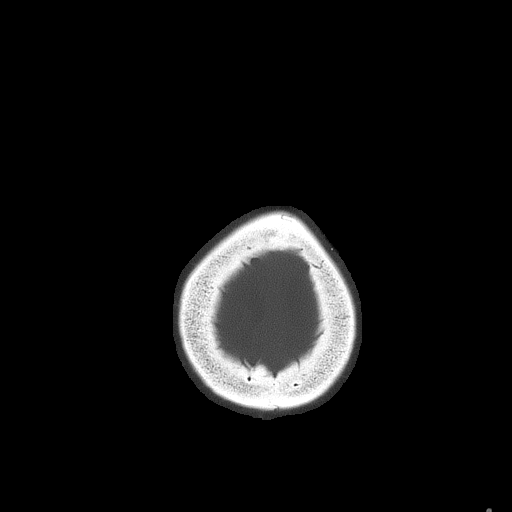

[14 of 30 positions shown; findings below may reference images not displayed]

FINDINGS: Small left frontal scalp contusion.  Negative for skull fracture.

Extensive sinusitis with mucosal edema throughout the paranasal
sinuses. Air-fluid levels in the maxillary sinus bilaterally.

Generalized atrophy. Negative for hydrocephalus. Chronic
microvascular ischemic change in the white matter. Negative for
acute infarct, hemorrhage, or mass lesion.
IMPRESSION: Atrophy and chronic microvascular ischemia. No acute intracranial
abnormality.

Left frontal scalp contusion

Sinusitis

## 2015-04-17 IMAGING — CR DG CHEST 2V
1 series · 2 of 2 positions shown · non-contrast
Comparison: 11/04/2013.

CLINICAL DATA: Pneumonia .

EXAM:
CHEST  2 VIEW

[Series 1: x chest ap · 0.14mm/px · 2 of 2 slices shown]
[im 1/2]
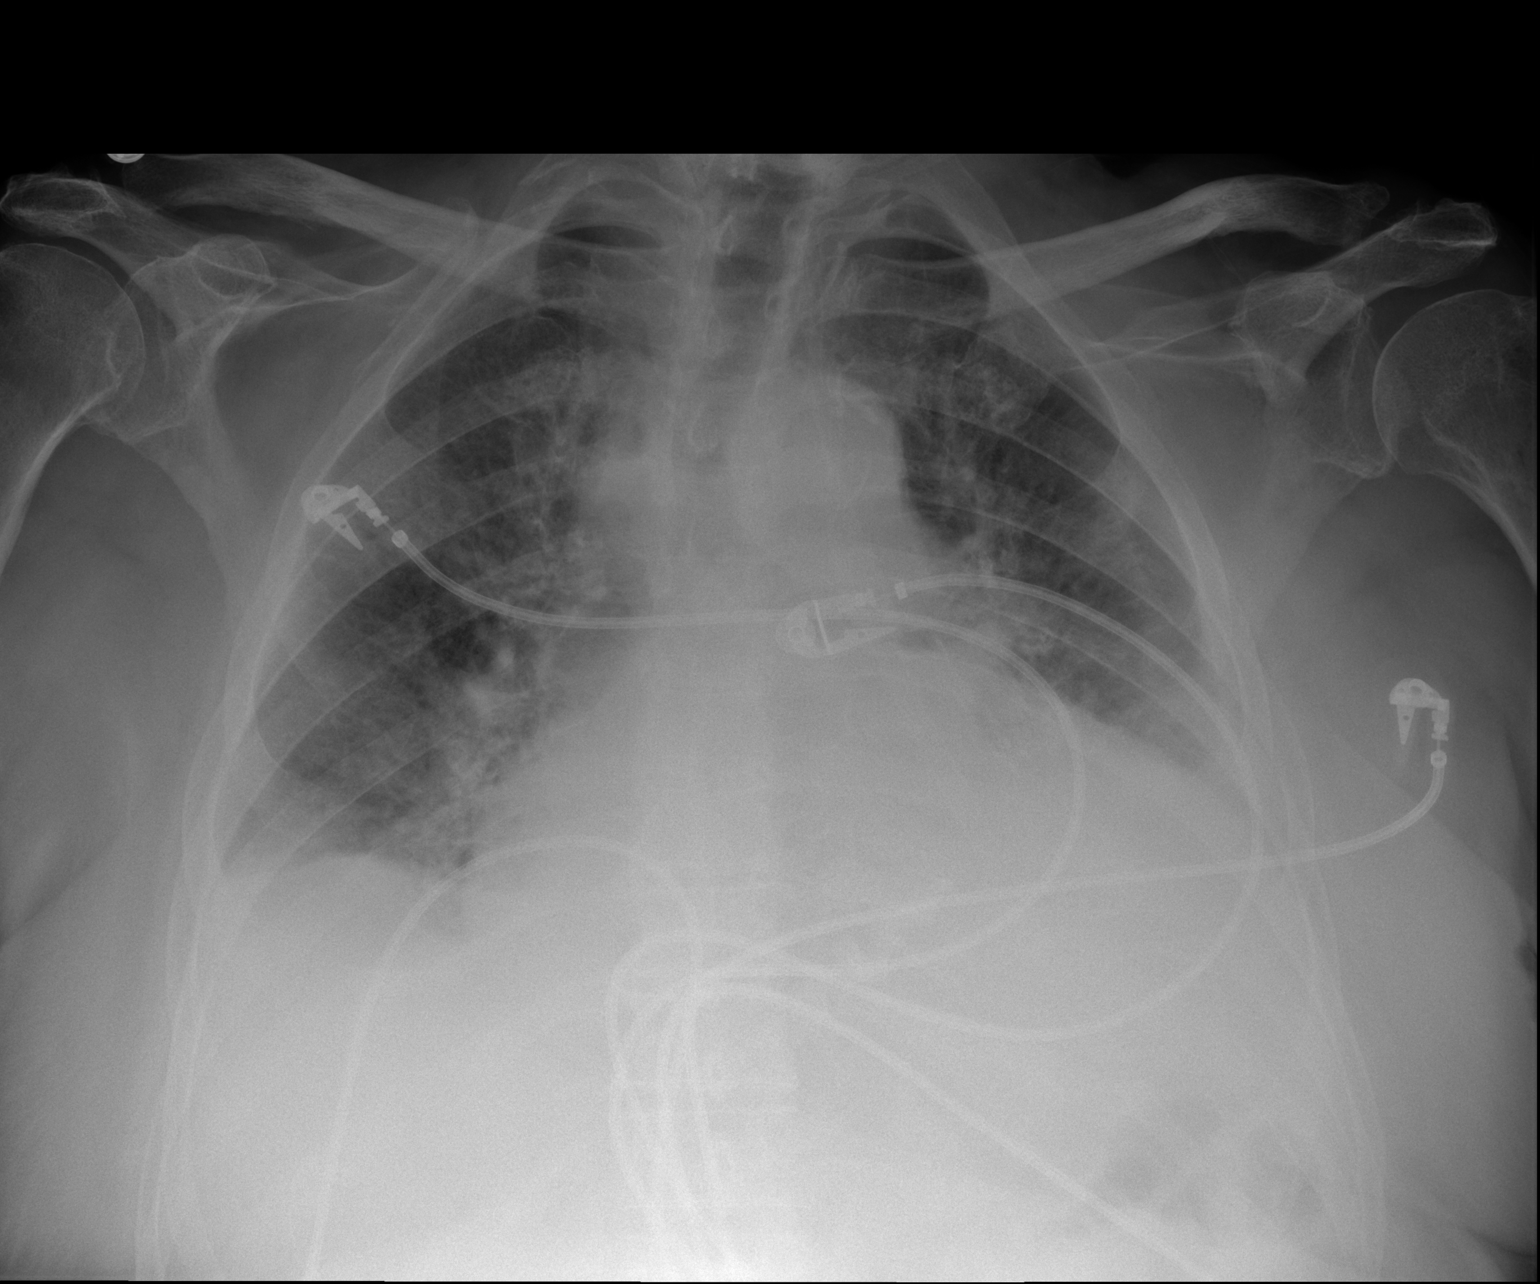
[im 2/2]
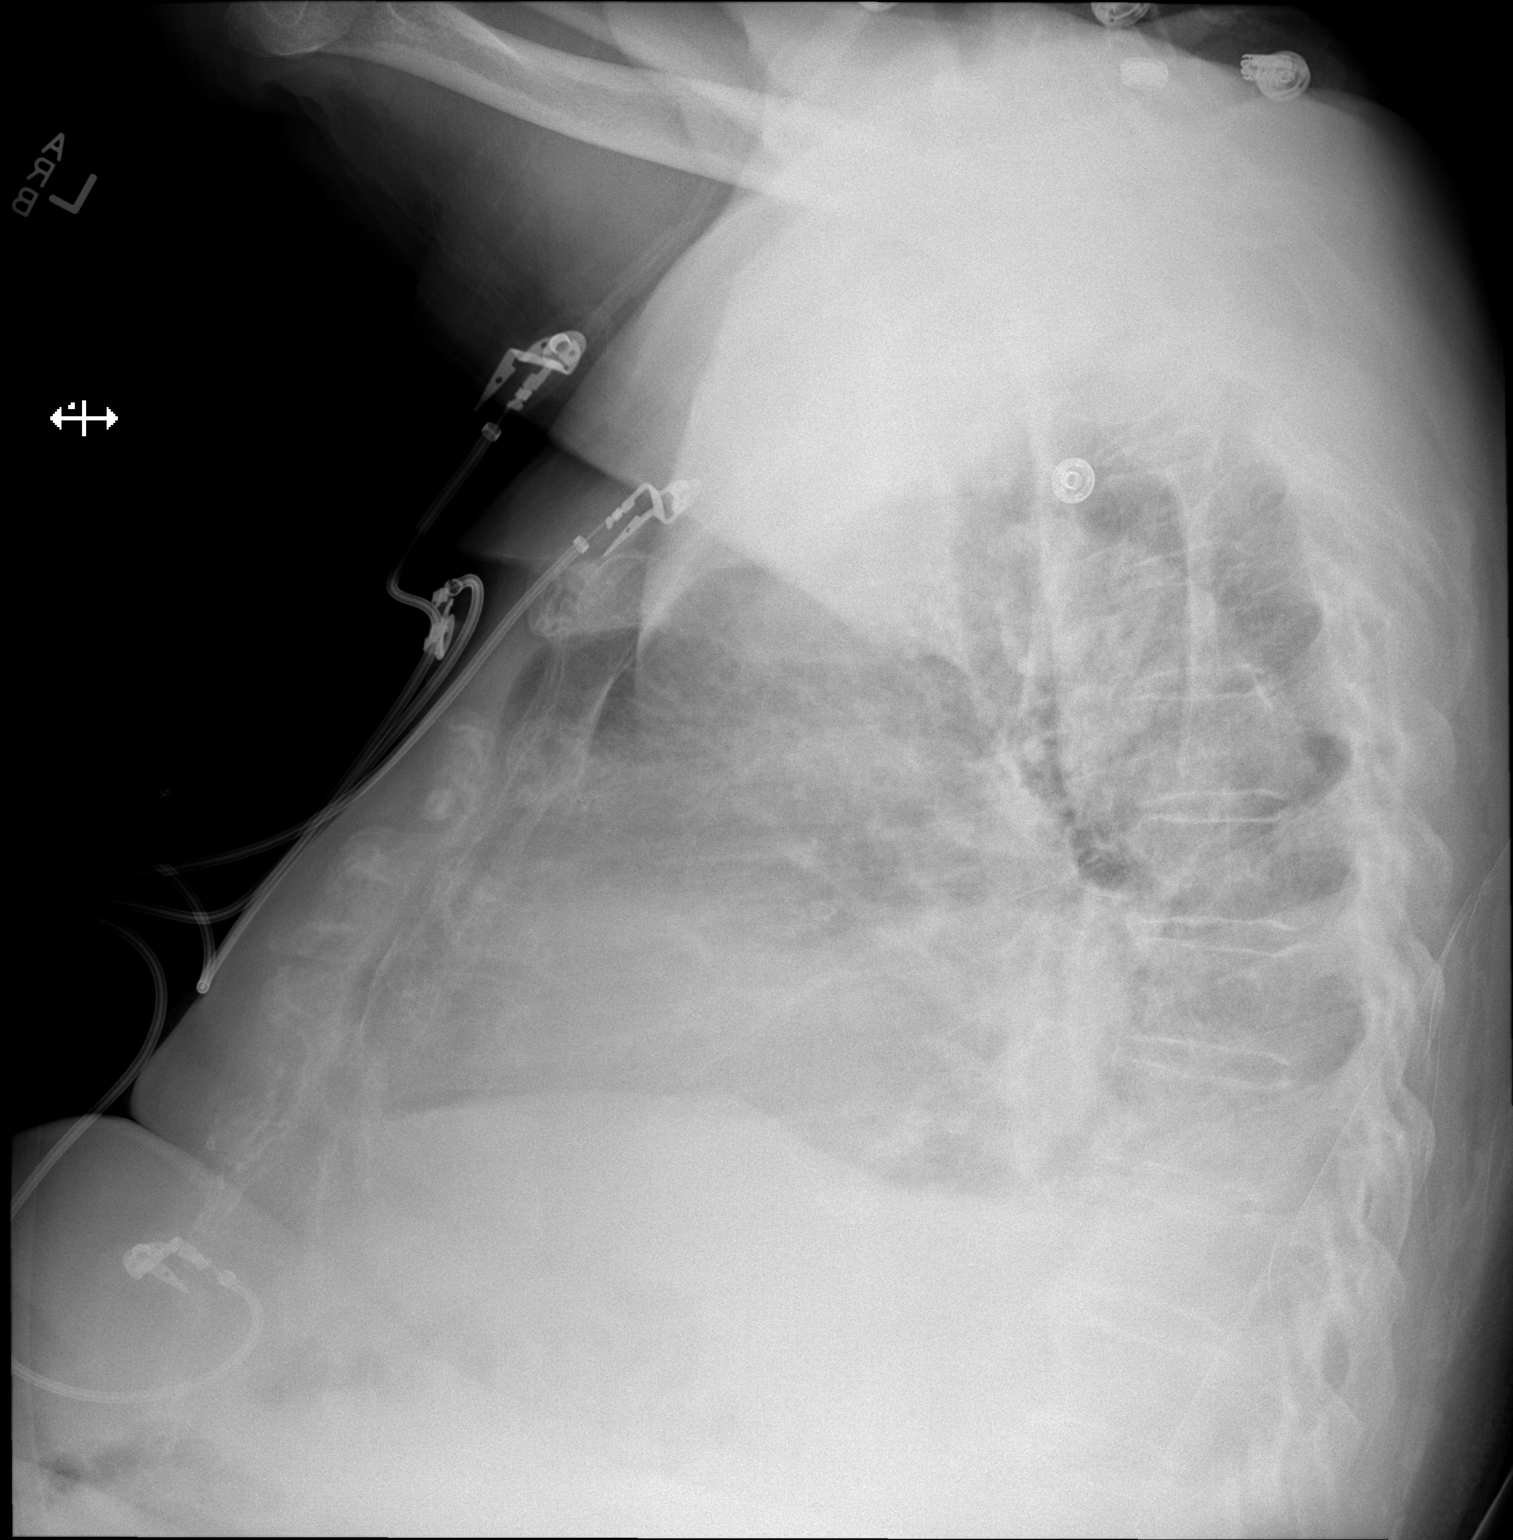

[2 of 2 positions shown; findings below may reference images not displayed]

FINDINGS: Mediastinum and hilar structures normal. Cardiomegaly with bilateral
interstitial prominence and bilateral effusions noted. These
findings are consistent with congestive heart failure. Basilar
atelectasis and/or infiltrates/edema noted. No pneumothorax. No
acute osseus abnormality .
IMPRESSION: 1. Congestive heart failure or pulmonary interstitial edema small
pleural effusions. Findings are progressive from prior exam.

2.  Basilar atelectasis and/or infiltrates.

## 2016-04-09 IMAGING — CR DG CHEST 1V PORT
1 series · 1 of 1 positions shown · non-contrast
Comparison: 10/24/2014

CLINICAL DATA: Shortness of breath for 4-5 days

EXAM:
PORTABLE CHEST - 1 VIEW

[ap]
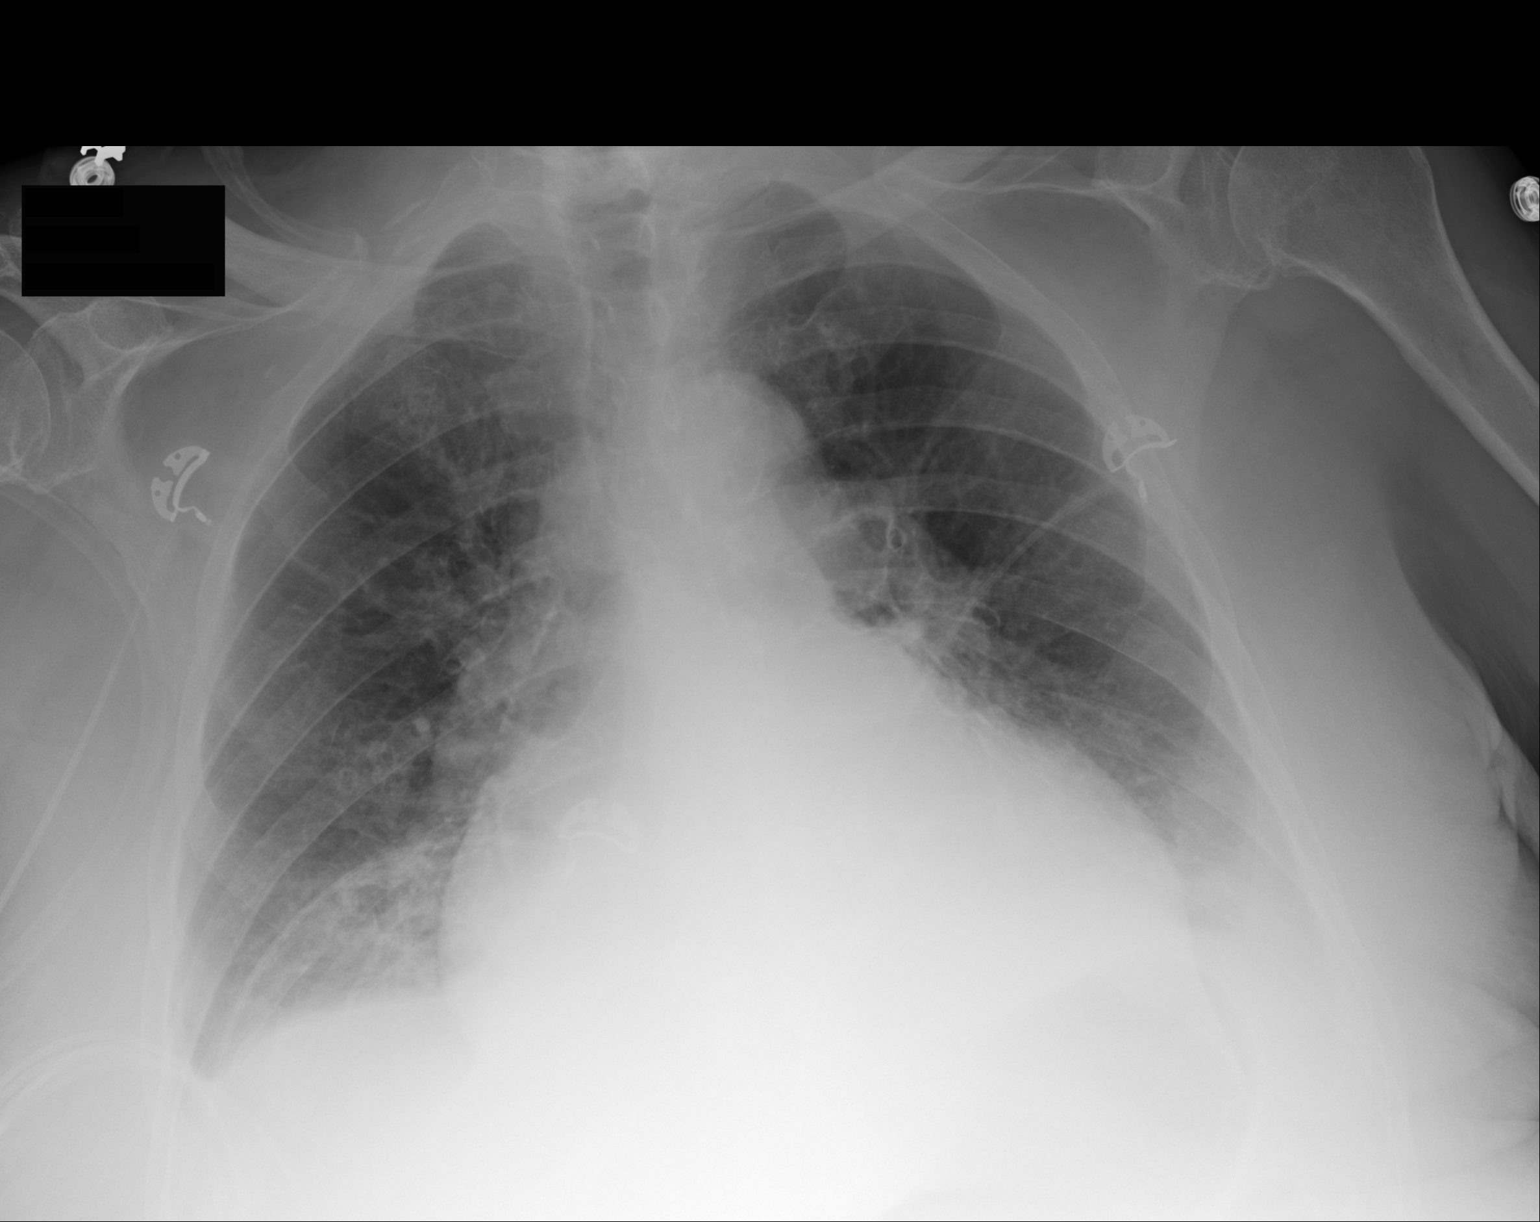

[1 of 1 positions shown; findings below may reference images not displayed]

FINDINGS: There is moderate cardiac enlargement. Small bilateral pleural
effusions are identified. There is mild interstitial edema. No
airspace consolidation.
IMPRESSION: 1. Mild CHF.

## 2016-06-26 IMAGING — US US EXTREM  UP VENOUS*R*
1 series · 14 of 24 positions shown · non-contrast
Comparison: None.

CLINICAL DATA: Right upper extremity swell

EXAM:
RIGHT UPPER EXTREMITY VENOUS DUPLEX ULTRASOUND
TECHNIQUE: Doppler venous assessment of the right upper extremity deep venous
system was performed, including characterization of spectral flow,
compressibility, and phasicity.

[Series 1: us extrem up venous*right* · 0.08mm/px · 14 of 47 slices shown]
[im 1/47]
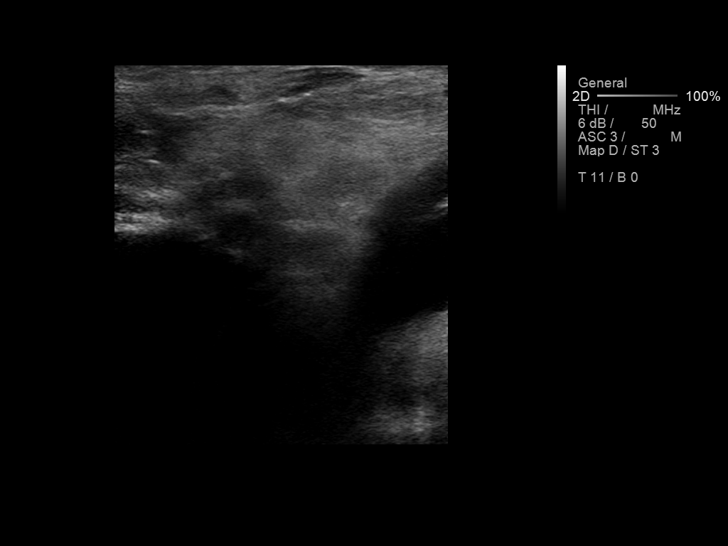
[im 5/47]
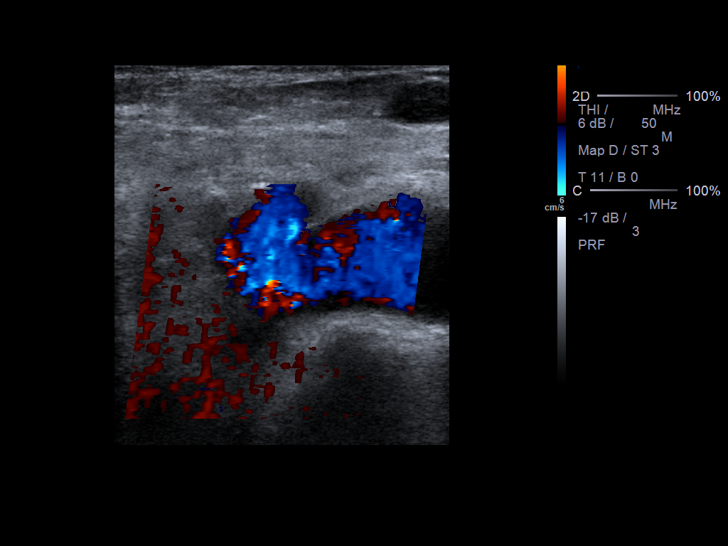
[im 9/47]
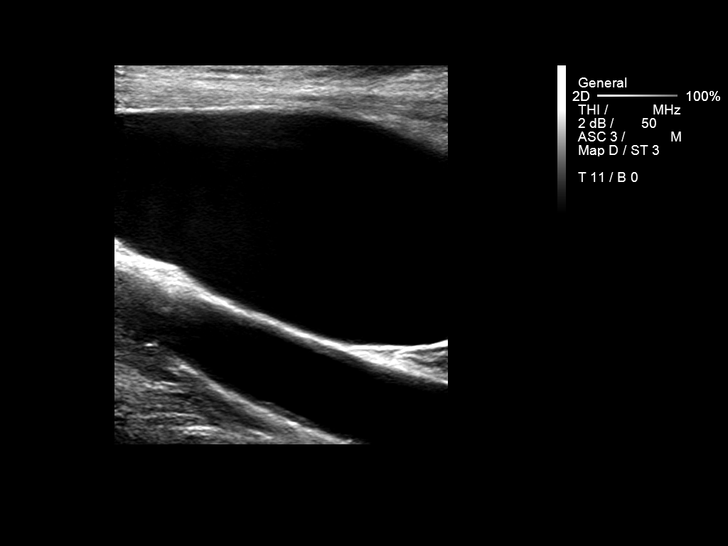
[im 13/47]
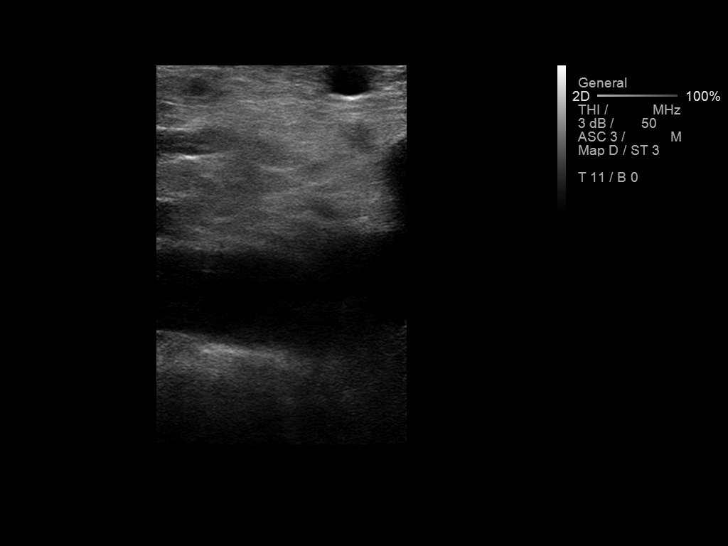
[im 15/47]
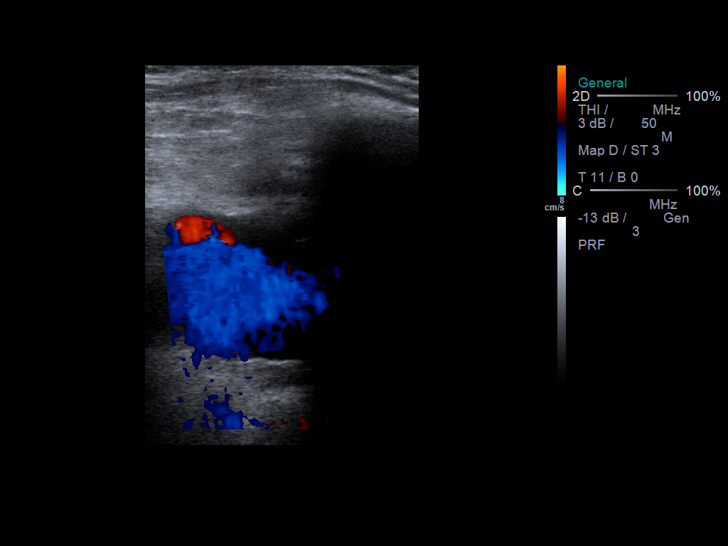
[im 19/47]
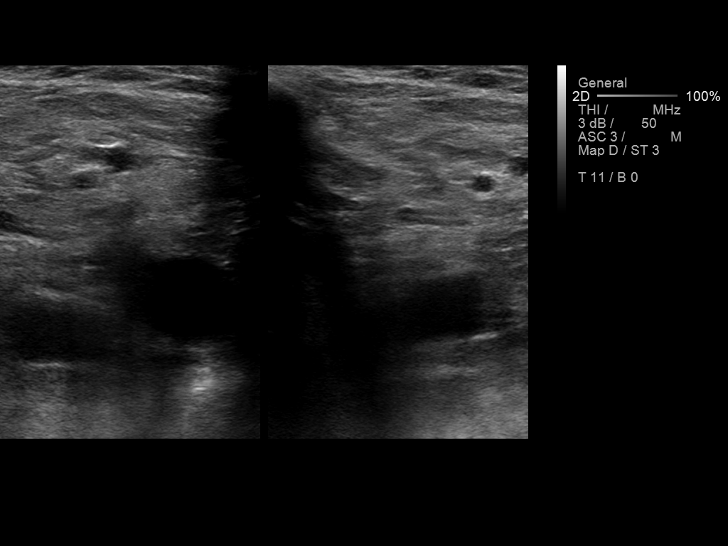
[im 23/47]
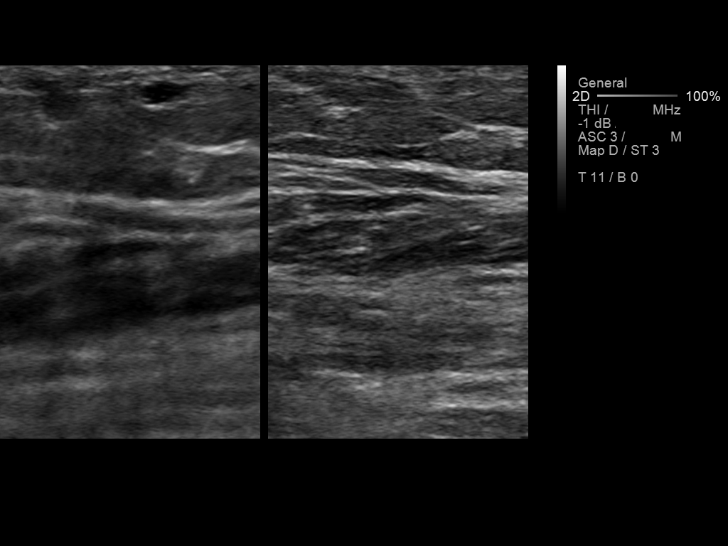
[im 25/47]
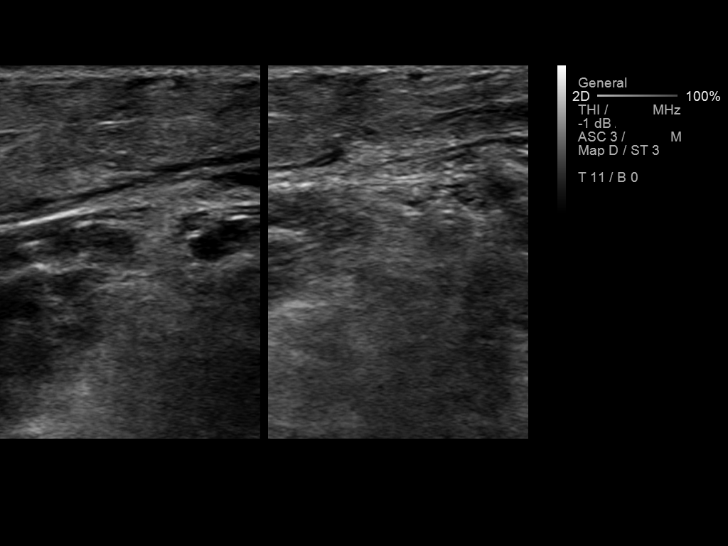
[im 29/47]
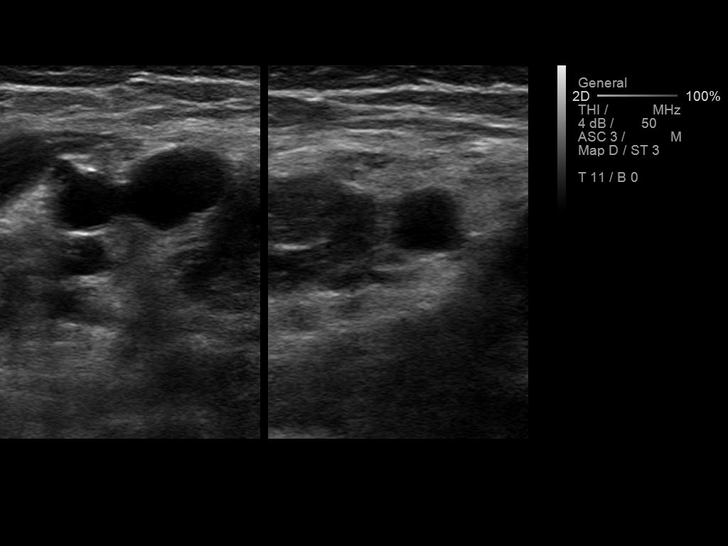
[im 33/47]
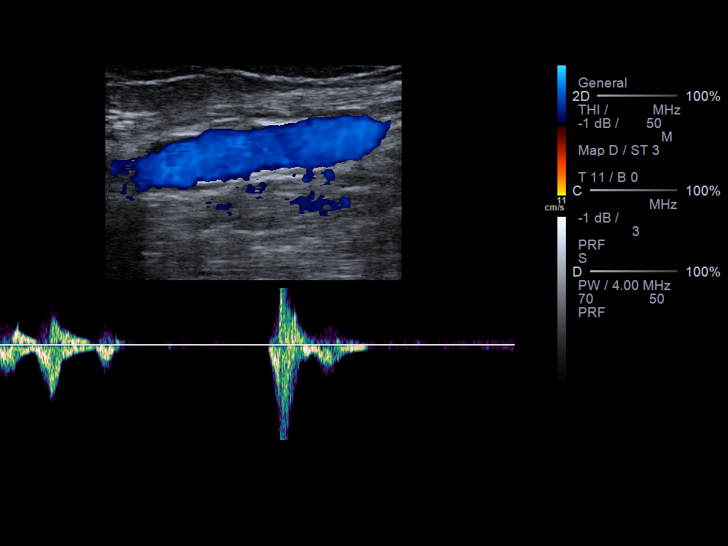
[im 37/47]
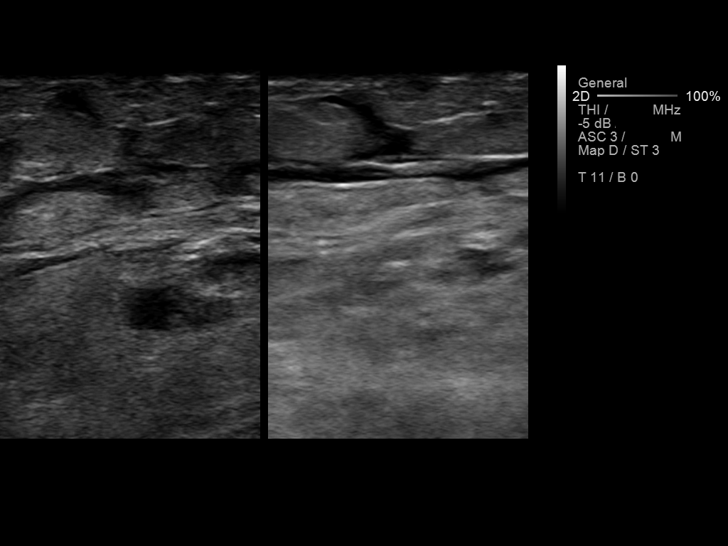
[im 39/47]
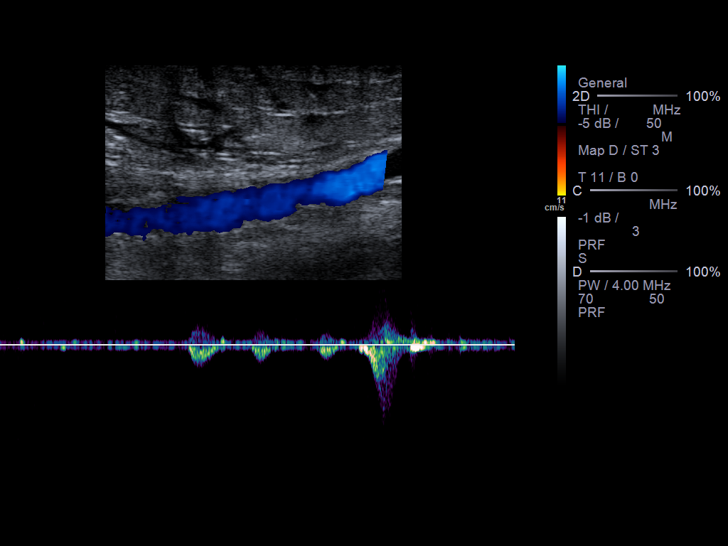
[im 43/47]
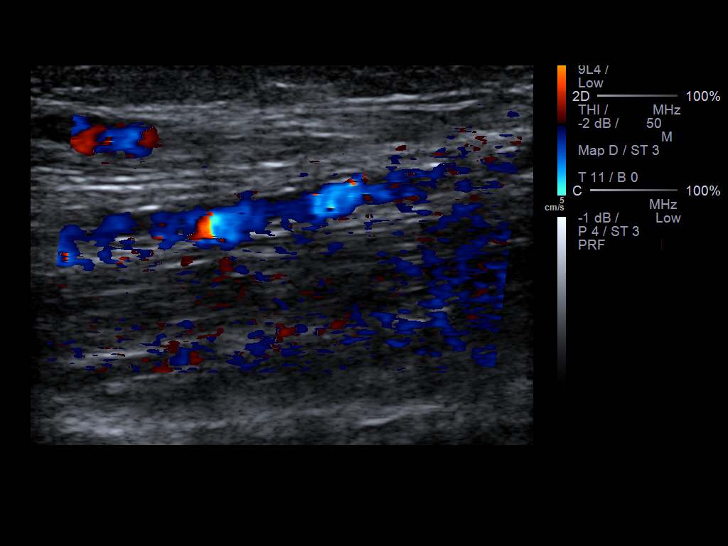
[im 47/47]
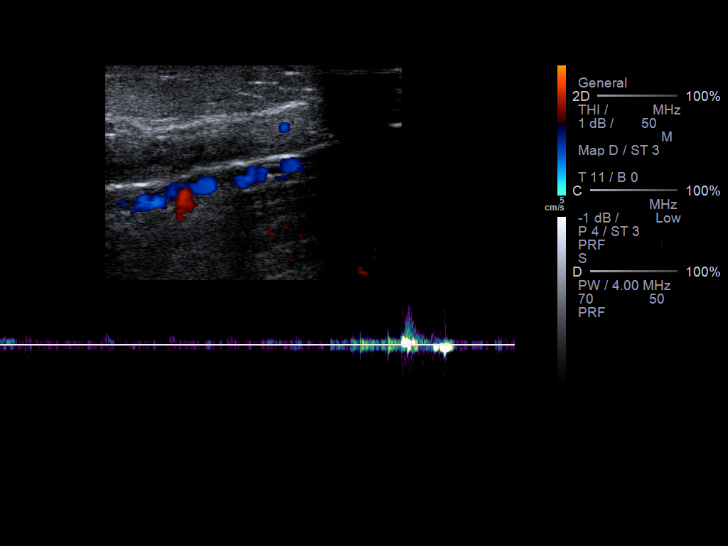

[14 of 24 positions shown; findings below may reference images not displayed]

FINDINGS: Right internal jugular, subclavian, axillary, and brachial veins are
compressible. They are patent by color flow imaging. Phasic venous
waveforms are noted on Doppler analysis.

The forearm veins were difficult to visualize due to edema. No
obvious DVT is present in the forearm.
IMPRESSION: No evidence of right extremity DVT.
